# Patient Record
Sex: Female | Born: 1945
Health system: Southern US, Community
[De-identification: ages and names within clinical notes are randomized; demographics above are authoritative.]

## PROBLEM LIST (undated history)

## (undated) DIAGNOSIS — F329 Major depressive disorder, single episode, unspecified: Secondary | ICD-10-CM

## (undated) DIAGNOSIS — E785 Hyperlipidemia, unspecified: Secondary | ICD-10-CM

## (undated) DIAGNOSIS — C801 Malignant (primary) neoplasm, unspecified: Secondary | ICD-10-CM

## (undated) DIAGNOSIS — F32A Depression, unspecified: Secondary | ICD-10-CM

## (undated) DIAGNOSIS — G8929 Other chronic pain: Secondary | ICD-10-CM

## (undated) DIAGNOSIS — S52502A Unspecified fracture of the lower end of left radius, initial encounter for closed fracture: Secondary | ICD-10-CM

## (undated) DIAGNOSIS — K219 Gastro-esophageal reflux disease without esophagitis: Secondary | ICD-10-CM

## (undated) DIAGNOSIS — F419 Anxiety disorder, unspecified: Secondary | ICD-10-CM

## (undated) HISTORY — PX: LAPAROSCOPY: SHX197

## (undated) HISTORY — PX: HAND SURGERY: SHX662

## (undated) HISTORY — DX: Malignant (primary) neoplasm, unspecified: C80.1

## (undated) HISTORY — PX: EYE SURGERY: SHX253

## (undated) HISTORY — DX: Hyperlipidemia, unspecified: E78.5

## (undated) HISTORY — DX: Gastro-esophageal reflux disease without esophagitis: K21.9

## (undated) HISTORY — DX: Depression, unspecified: F32.A

---
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
## 1998-01-16 ENCOUNTER — Ambulatory Visit (HOSPITAL_COMMUNITY): Admission: RE | Admit: 1998-01-16 | Discharge: 1998-01-16 | Payer: Self-pay | Admitting: Gynecology

## 1998-01-23 ENCOUNTER — Other Ambulatory Visit: Admission: RE | Admit: 1998-01-23 | Discharge: 1998-01-23 | Payer: Self-pay | Admitting: Gynecology

## 1999-03-13 ENCOUNTER — Other Ambulatory Visit: Admission: RE | Admit: 1999-03-13 | Discharge: 1999-03-13 | Payer: Self-pay | Admitting: Gynecology

## 1999-03-13 ENCOUNTER — Encounter (INDEPENDENT_AMBULATORY_CARE_PROVIDER_SITE_OTHER): Payer: Self-pay

## 1999-08-13 ENCOUNTER — Emergency Department (HOSPITAL_COMMUNITY): Admission: EM | Admit: 1999-08-13 | Discharge: 1999-08-14 | Payer: Self-pay | Admitting: Emergency Medicine

## 1999-08-27 ENCOUNTER — Encounter: Payer: Self-pay | Admitting: Gynecology

## 1999-08-27 ENCOUNTER — Ambulatory Visit (HOSPITAL_COMMUNITY): Admission: RE | Admit: 1999-08-27 | Discharge: 1999-08-27 | Payer: Self-pay | Admitting: Gynecology

## 2000-04-20 ENCOUNTER — Other Ambulatory Visit: Admission: RE | Admit: 2000-04-20 | Discharge: 2000-04-20 | Payer: Self-pay | Admitting: Gynecology

## 2001-05-17 ENCOUNTER — Other Ambulatory Visit: Admission: RE | Admit: 2001-05-17 | Discharge: 2001-05-17 | Payer: Self-pay | Admitting: Gynecology

## 2001-05-18 ENCOUNTER — Encounter: Payer: Self-pay | Admitting: Gynecology

## 2001-05-18 ENCOUNTER — Ambulatory Visit (HOSPITAL_COMMUNITY): Admission: RE | Admit: 2001-05-18 | Discharge: 2001-05-18 | Payer: Self-pay | Admitting: Gynecology

## 2001-10-14 ENCOUNTER — Encounter: Admission: RE | Admit: 2001-10-14 | Discharge: 2001-10-14 | Payer: Self-pay | Admitting: Gynecology

## 2001-10-14 ENCOUNTER — Encounter: Payer: Self-pay | Admitting: Gynecology

## 2002-07-25 ENCOUNTER — Other Ambulatory Visit: Admission: RE | Admit: 2002-07-25 | Discharge: 2002-07-25 | Payer: Self-pay | Admitting: Gynecology

## 2002-10-03 ENCOUNTER — Encounter: Admission: RE | Admit: 2002-10-03 | Discharge: 2002-10-03 | Payer: Self-pay | Admitting: Gynecology

## 2002-10-03 ENCOUNTER — Encounter: Payer: Self-pay | Admitting: Gynecology

## 2003-07-25 ENCOUNTER — Other Ambulatory Visit: Admission: RE | Admit: 2003-07-25 | Discharge: 2003-07-25 | Payer: Self-pay | Admitting: Gynecology

## 2004-01-30 ENCOUNTER — Ambulatory Visit (HOSPITAL_COMMUNITY): Admission: RE | Admit: 2004-01-30 | Discharge: 2004-01-30 | Payer: Self-pay | Admitting: Gastroenterology

## 2004-11-19 ENCOUNTER — Other Ambulatory Visit: Admission: RE | Admit: 2004-11-19 | Discharge: 2004-11-19 | Payer: Self-pay | Admitting: Gynecology

## 2004-12-23 ENCOUNTER — Ambulatory Visit (HOSPITAL_COMMUNITY): Admission: RE | Admit: 2004-12-23 | Discharge: 2004-12-23 | Payer: Self-pay | Admitting: Gynecology

## 2006-02-13 ENCOUNTER — Ambulatory Visit (HOSPITAL_COMMUNITY): Admission: RE | Admit: 2006-02-13 | Discharge: 2006-02-13 | Payer: Self-pay | Admitting: Gynecology

## 2007-11-12 ENCOUNTER — Ambulatory Visit (HOSPITAL_COMMUNITY): Admission: RE | Admit: 2007-11-12 | Discharge: 2007-11-12 | Payer: Self-pay | Admitting: Gynecology

## 2010-03-23 ENCOUNTER — Encounter: Payer: Self-pay | Admitting: Gynecology

## 2010-07-19 NOTE — Op Note (Signed)
NAMEDONYELLE, ENYEART              ACCOUNT NO.:  [id number]   MEDICAL RECORD NO.:  [id number]          PATIENT TYPE:  AMB   LOCATION:  ENDO                         FACILITY:  [facility]   PHYSICIAN:  Graylin Shiver, M.D.   DATE OF BIRTH:  05-Mar-1945   DATE OF PROCEDURE:  01/30/2004  DATE OF DISCHARGE:                                 OPERATIVE REPORT   PROCEDURE:  Colonoscopy.   INDICATION:  Screening.   Informed consent was obtained after explanation of the risks of bleeding,  infection, or perforation.   PREMEDICATION:  Fentanyl 75 mcg IV, Versed 5 mg IV.   DESCRIPTION OF PROCEDURE:  With the patient in the left lateral decubitus  position, a rectal exam was performed and no masses were felt.  The Olympus  colonoscope was inserted into the rectum and advanced around the colon to  the cecum, cecal landmarks were identified.  The cecum and ascending colon  were normal.  The transverse colon was normal.  The descending colon,  sigmoid and rectum were normal.  She tolerated the procedure well without  complications.   IMPRESSION:  Normal colonoscopy to the cecum.       SFG/MEDQ  D:  01/30/2004  T:  01/30/2004  Job:  [id number]   cc:   Sigmund Hazel, M.D.  [address]  [address] [place], [place] [address]  Fax: [phone]

## 2015-03-06 DIAGNOSIS — M9901 Segmental and somatic dysfunction of cervical region: Secondary | ICD-10-CM | POA: Diagnosis not present

## 2015-03-06 DIAGNOSIS — M9913 Subluxation complex (vertebral) of lumbar region: Secondary | ICD-10-CM | POA: Diagnosis not present

## 2015-03-06 DIAGNOSIS — M9912 Subluxation complex (vertebral) of thoracic region: Secondary | ICD-10-CM | POA: Diagnosis not present

## 2015-03-06 DIAGNOSIS — M9915 Subluxation complex (vertebral) of pelvic region: Secondary | ICD-10-CM | POA: Diagnosis not present

## 2015-03-06 DIAGNOSIS — M542 Cervicalgia: Secondary | ICD-10-CM | POA: Diagnosis not present

## 2015-03-06 DIAGNOSIS — M9911 Subluxation complex (vertebral) of cervical region: Secondary | ICD-10-CM | POA: Diagnosis not present

## 2015-03-06 DIAGNOSIS — M9914 Subluxation complex (vertebral) of sacral region: Secondary | ICD-10-CM | POA: Diagnosis not present

## 2015-03-14 DIAGNOSIS — Z1283 Encounter for screening for malignant neoplasm of skin: Secondary | ICD-10-CM | POA: Diagnosis not present

## 2015-03-14 DIAGNOSIS — Z85820 Personal history of malignant melanoma of skin: Secondary | ICD-10-CM | POA: Diagnosis not present

## 2015-03-14 DIAGNOSIS — Z08 Encounter for follow-up examination after completed treatment for malignant neoplasm: Secondary | ICD-10-CM | POA: Diagnosis not present

## 2015-04-16 DIAGNOSIS — M542 Cervicalgia: Secondary | ICD-10-CM | POA: Diagnosis not present

## 2015-04-16 DIAGNOSIS — M9911 Subluxation complex (vertebral) of cervical region: Secondary | ICD-10-CM | POA: Diagnosis not present

## 2015-04-16 DIAGNOSIS — M9901 Segmental and somatic dysfunction of cervical region: Secondary | ICD-10-CM | POA: Diagnosis not present

## 2015-04-16 DIAGNOSIS — M9914 Subluxation complex (vertebral) of sacral region: Secondary | ICD-10-CM | POA: Diagnosis not present

## 2015-04-16 DIAGNOSIS — M9912 Subluxation complex (vertebral) of thoracic region: Secondary | ICD-10-CM | POA: Diagnosis not present

## 2015-04-16 DIAGNOSIS — M9915 Subluxation complex (vertebral) of pelvic region: Secondary | ICD-10-CM | POA: Diagnosis not present

## 2015-04-16 DIAGNOSIS — M9913 Subluxation complex (vertebral) of lumbar region: Secondary | ICD-10-CM | POA: Diagnosis not present

## 2015-05-16 DIAGNOSIS — M542 Cervicalgia: Secondary | ICD-10-CM | POA: Diagnosis not present

## 2015-05-16 DIAGNOSIS — M9913 Subluxation complex (vertebral) of lumbar region: Secondary | ICD-10-CM | POA: Diagnosis not present

## 2015-05-16 DIAGNOSIS — M9912 Subluxation complex (vertebral) of thoracic region: Secondary | ICD-10-CM | POA: Diagnosis not present

## 2015-05-16 DIAGNOSIS — M9915 Subluxation complex (vertebral) of pelvic region: Secondary | ICD-10-CM | POA: Diagnosis not present

## 2015-05-16 DIAGNOSIS — M9911 Subluxation complex (vertebral) of cervical region: Secondary | ICD-10-CM | POA: Diagnosis not present

## 2015-05-16 DIAGNOSIS — M9901 Segmental and somatic dysfunction of cervical region: Secondary | ICD-10-CM | POA: Diagnosis not present

## 2015-05-16 DIAGNOSIS — M9914 Subluxation complex (vertebral) of sacral region: Secondary | ICD-10-CM | POA: Diagnosis not present

## 2015-06-06 DIAGNOSIS — M8589 Other specified disorders of bone density and structure, multiple sites: Secondary | ICD-10-CM | POA: Diagnosis not present

## 2015-06-06 DIAGNOSIS — M859 Disorder of bone density and structure, unspecified: Secondary | ICD-10-CM | POA: Diagnosis not present

## 2015-09-03 DIAGNOSIS — M9901 Segmental and somatic dysfunction of cervical region: Secondary | ICD-10-CM | POA: Diagnosis not present

## 2015-09-03 DIAGNOSIS — M542 Cervicalgia: Secondary | ICD-10-CM | POA: Diagnosis not present

## 2015-10-03 DIAGNOSIS — E7800 Pure hypercholesterolemia, unspecified: Secondary | ICD-10-CM | POA: Diagnosis not present

## 2015-10-03 DIAGNOSIS — Z0000 Encounter for general adult medical examination without abnormal findings: Secondary | ICD-10-CM | POA: Diagnosis not present

## 2015-10-03 DIAGNOSIS — K219 Gastro-esophageal reflux disease without esophagitis: Secondary | ICD-10-CM | POA: Diagnosis not present

## 2015-10-08 DIAGNOSIS — M9901 Segmental and somatic dysfunction of cervical region: Secondary | ICD-10-CM | POA: Diagnosis not present

## 2015-10-08 DIAGNOSIS — M542 Cervicalgia: Secondary | ICD-10-CM | POA: Diagnosis not present

## 2015-10-09 DIAGNOSIS — M9901 Segmental and somatic dysfunction of cervical region: Secondary | ICD-10-CM | POA: Diagnosis not present

## 2015-10-09 DIAGNOSIS — M542 Cervicalgia: Secondary | ICD-10-CM | POA: Diagnosis not present

## 2015-10-10 DIAGNOSIS — M9901 Segmental and somatic dysfunction of cervical region: Secondary | ICD-10-CM | POA: Diagnosis not present

## 2015-10-10 DIAGNOSIS — M542 Cervicalgia: Secondary | ICD-10-CM | POA: Diagnosis not present

## 2015-10-11 DIAGNOSIS — M9901 Segmental and somatic dysfunction of cervical region: Secondary | ICD-10-CM | POA: Diagnosis not present

## 2015-10-11 DIAGNOSIS — M542 Cervicalgia: Secondary | ICD-10-CM | POA: Diagnosis not present

## 2015-10-16 DIAGNOSIS — M542 Cervicalgia: Secondary | ICD-10-CM | POA: Diagnosis not present

## 2015-10-16 DIAGNOSIS — M9901 Segmental and somatic dysfunction of cervical region: Secondary | ICD-10-CM | POA: Diagnosis not present

## 2016-01-01 DIAGNOSIS — M4802 Spinal stenosis, cervical region: Secondary | ICD-10-CM | POA: Diagnosis not present

## 2016-01-01 DIAGNOSIS — Z23 Encounter for immunization: Secondary | ICD-10-CM | POA: Diagnosis not present

## 2016-01-01 DIAGNOSIS — Z79899 Other long term (current) drug therapy: Secondary | ICD-10-CM | POA: Diagnosis not present

## 2016-01-01 DIAGNOSIS — E7800 Pure hypercholesterolemia, unspecified: Secondary | ICD-10-CM | POA: Diagnosis not present

## 2016-01-01 DIAGNOSIS — R69 Illness, unspecified: Secondary | ICD-10-CM | POA: Diagnosis not present

## 2016-01-01 DIAGNOSIS — Z1159 Encounter for screening for other viral diseases: Secondary | ICD-10-CM | POA: Diagnosis not present

## 2016-01-01 DIAGNOSIS — Z0000 Encounter for general adult medical examination without abnormal findings: Secondary | ICD-10-CM | POA: Diagnosis not present

## 2016-01-01 DIAGNOSIS — Z85820 Personal history of malignant melanoma of skin: Secondary | ICD-10-CM | POA: Diagnosis not present

## 2016-01-01 DIAGNOSIS — M859 Disorder of bone density and structure, unspecified: Secondary | ICD-10-CM | POA: Diagnosis not present

## 2016-02-06 DIAGNOSIS — M542 Cervicalgia: Secondary | ICD-10-CM | POA: Diagnosis not present

## 2016-02-06 DIAGNOSIS — M9901 Segmental and somatic dysfunction of cervical region: Secondary | ICD-10-CM | POA: Diagnosis not present

## 2016-03-20 DIAGNOSIS — M9901 Segmental and somatic dysfunction of cervical region: Secondary | ICD-10-CM | POA: Diagnosis not present

## 2016-03-20 DIAGNOSIS — M542 Cervicalgia: Secondary | ICD-10-CM | POA: Diagnosis not present

## 2016-03-24 DIAGNOSIS — M9901 Segmental and somatic dysfunction of cervical region: Secondary | ICD-10-CM | POA: Diagnosis not present

## 2016-03-24 DIAGNOSIS — M542 Cervicalgia: Secondary | ICD-10-CM | POA: Diagnosis not present

## 2016-04-01 DIAGNOSIS — Z01419 Encounter for gynecological examination (general) (routine) without abnormal findings: Secondary | ICD-10-CM | POA: Diagnosis not present

## 2016-04-01 DIAGNOSIS — Z6826 Body mass index (BMI) 26.0-26.9, adult: Secondary | ICD-10-CM | POA: Diagnosis not present

## 2016-04-01 DIAGNOSIS — Z1231 Encounter for screening mammogram for malignant neoplasm of breast: Secondary | ICD-10-CM | POA: Diagnosis not present

## 2016-05-06 DIAGNOSIS — Z0000 Encounter for general adult medical examination without abnormal findings: Secondary | ICD-10-CM | POA: Diagnosis not present

## 2016-05-06 DIAGNOSIS — M13162 Monoarthritis, not elsewhere classified, left knee: Secondary | ICD-10-CM | POA: Diagnosis not present

## 2016-05-06 DIAGNOSIS — E7800 Pure hypercholesterolemia, unspecified: Secondary | ICD-10-CM | POA: Diagnosis not present

## 2016-05-06 DIAGNOSIS — N329 Bladder disorder, unspecified: Secondary | ICD-10-CM | POA: Diagnosis not present

## 2016-05-06 DIAGNOSIS — K219 Gastro-esophageal reflux disease without esophagitis: Secondary | ICD-10-CM | POA: Diagnosis not present

## 2016-05-06 DIAGNOSIS — R69 Illness, unspecified: Secondary | ICD-10-CM | POA: Diagnosis not present

## 2016-05-06 DIAGNOSIS — M13161 Monoarthritis, not elsewhere classified, right knee: Secondary | ICD-10-CM | POA: Diagnosis not present

## 2016-05-06 DIAGNOSIS — Z6826 Body mass index (BMI) 26.0-26.9, adult: Secondary | ICD-10-CM | POA: Diagnosis not present

## 2016-05-07 DIAGNOSIS — M9907 Segmental and somatic dysfunction of upper extremity: Secondary | ICD-10-CM | POA: Diagnosis not present

## 2016-05-07 DIAGNOSIS — M542 Cervicalgia: Secondary | ICD-10-CM | POA: Diagnosis not present

## 2016-05-07 DIAGNOSIS — M9901 Segmental and somatic dysfunction of cervical region: Secondary | ICD-10-CM | POA: Diagnosis not present

## 2016-05-07 DIAGNOSIS — M9905 Segmental and somatic dysfunction of pelvic region: Secondary | ICD-10-CM | POA: Diagnosis not present

## 2016-05-07 DIAGNOSIS — M9902 Segmental and somatic dysfunction of thoracic region: Secondary | ICD-10-CM | POA: Diagnosis not present

## 2016-05-07 DIAGNOSIS — M9903 Segmental and somatic dysfunction of lumbar region: Secondary | ICD-10-CM | POA: Diagnosis not present

## 2016-08-12 DIAGNOSIS — M9901 Segmental and somatic dysfunction of cervical region: Secondary | ICD-10-CM | POA: Diagnosis not present

## 2016-08-12 DIAGNOSIS — M542 Cervicalgia: Secondary | ICD-10-CM | POA: Diagnosis not present

## 2016-09-10 DIAGNOSIS — M9903 Segmental and somatic dysfunction of lumbar region: Secondary | ICD-10-CM | POA: Diagnosis not present

## 2016-09-10 DIAGNOSIS — M9901 Segmental and somatic dysfunction of cervical region: Secondary | ICD-10-CM | POA: Diagnosis not present

## 2016-09-10 DIAGNOSIS — M9902 Segmental and somatic dysfunction of thoracic region: Secondary | ICD-10-CM | POA: Diagnosis not present

## 2016-10-14 DIAGNOSIS — L821 Other seborrheic keratosis: Secondary | ICD-10-CM | POA: Diagnosis not present

## 2016-10-14 DIAGNOSIS — M1711 Unilateral primary osteoarthritis, right knee: Secondary | ICD-10-CM | POA: Diagnosis not present

## 2016-10-14 DIAGNOSIS — Z85820 Personal history of malignant melanoma of skin: Secondary | ICD-10-CM | POA: Diagnosis not present

## 2016-10-14 DIAGNOSIS — Z1283 Encounter for screening for malignant neoplasm of skin: Secondary | ICD-10-CM | POA: Diagnosis not present

## 2016-10-14 DIAGNOSIS — M1712 Unilateral primary osteoarthritis, left knee: Secondary | ICD-10-CM | POA: Diagnosis not present

## 2016-10-14 DIAGNOSIS — C44619 Basal cell carcinoma of skin of left upper limb, including shoulder: Secondary | ICD-10-CM | POA: Diagnosis not present

## 2016-10-14 DIAGNOSIS — Z08 Encounter for follow-up examination after completed treatment for malignant neoplasm: Secondary | ICD-10-CM | POA: Diagnosis not present

## 2016-11-04 DIAGNOSIS — M9903 Segmental and somatic dysfunction of lumbar region: Secondary | ICD-10-CM | POA: Diagnosis not present

## 2016-11-04 DIAGNOSIS — M9901 Segmental and somatic dysfunction of cervical region: Secondary | ICD-10-CM | POA: Diagnosis not present

## 2016-11-04 DIAGNOSIS — M9902 Segmental and somatic dysfunction of thoracic region: Secondary | ICD-10-CM | POA: Diagnosis not present

## 2017-01-13 DIAGNOSIS — R69 Illness, unspecified: Secondary | ICD-10-CM | POA: Diagnosis not present

## 2017-01-13 DIAGNOSIS — Z85820 Personal history of malignant melanoma of skin: Secondary | ICD-10-CM | POA: Diagnosis not present

## 2017-01-13 DIAGNOSIS — Z79899 Other long term (current) drug therapy: Secondary | ICD-10-CM | POA: Diagnosis not present

## 2017-01-13 DIAGNOSIS — M4802 Spinal stenosis, cervical region: Secondary | ICD-10-CM | POA: Diagnosis not present

## 2017-01-13 DIAGNOSIS — M859 Disorder of bone density and structure, unspecified: Secondary | ICD-10-CM | POA: Diagnosis not present

## 2017-01-13 DIAGNOSIS — Z0000 Encounter for general adult medical examination without abnormal findings: Secondary | ICD-10-CM | POA: Diagnosis not present

## 2017-01-13 DIAGNOSIS — E7800 Pure hypercholesterolemia, unspecified: Secondary | ICD-10-CM | POA: Diagnosis not present

## 2017-01-13 DIAGNOSIS — Z23 Encounter for immunization: Secondary | ICD-10-CM | POA: Diagnosis not present

## 2017-01-13 DIAGNOSIS — M170 Bilateral primary osteoarthritis of knee: Secondary | ICD-10-CM | POA: Diagnosis not present

## 2017-01-20 DIAGNOSIS — M9901 Segmental and somatic dysfunction of cervical region: Secondary | ICD-10-CM | POA: Diagnosis not present

## 2017-01-20 DIAGNOSIS — M9902 Segmental and somatic dysfunction of thoracic region: Secondary | ICD-10-CM | POA: Diagnosis not present

## 2017-01-20 DIAGNOSIS — M9903 Segmental and somatic dysfunction of lumbar region: Secondary | ICD-10-CM | POA: Diagnosis not present

## 2017-02-05 DIAGNOSIS — M1711 Unilateral primary osteoarthritis, right knee: Secondary | ICD-10-CM | POA: Diagnosis not present

## 2017-02-05 DIAGNOSIS — M1712 Unilateral primary osteoarthritis, left knee: Secondary | ICD-10-CM | POA: Diagnosis not present

## 2017-02-26 ENCOUNTER — Ambulatory Visit: Payer: Medicare HMO | Admitting: Urgent Care

## 2017-02-26 ENCOUNTER — Encounter: Payer: Self-pay | Admitting: Urgent Care

## 2017-02-26 VITALS — BP 121/79 | HR 74 | Temp 98.0°F | Resp 18 | Ht 63.0 in | Wt 147.8 lb

## 2017-02-26 DIAGNOSIS — J3489 Other specified disorders of nose and nasal sinuses: Secondary | ICD-10-CM | POA: Diagnosis not present

## 2017-02-26 DIAGNOSIS — R0982 Postnasal drip: Secondary | ICD-10-CM | POA: Diagnosis not present

## 2017-02-26 DIAGNOSIS — J0100 Acute maxillary sinusitis, unspecified: Secondary | ICD-10-CM

## 2017-02-26 DIAGNOSIS — R0981 Nasal congestion: Secondary | ICD-10-CM

## 2017-02-26 MED ORDER — AMOXICILLIN 500 MG PO CAPS: 500.0000 mg | ORAL_CAPSULE | Freq: Three times a day (TID) | ORAL | 0 refills | Status: DC

## 2017-02-26 NOTE — Patient Instructions (Signed)
Sinusitis, Adult Sinusitis is soreness and inflammation of your sinuses. Sinuses are hollow spaces in the bones around your face. They are located:  Around your eyes.  In the middle of your forehead.  Behind your nose.  In your cheekbones.  Your sinuses and nasal passages are lined with a stringy fluid (mucus). Mucus normally drains out of your sinuses. When your nasal tissues get inflamed or swollen, the mucus can get trapped or blocked so air cannot flow through your sinuses. This lets bacteria, viruses, and funguses grow, and that leads to infection. Follow these instructions at home: Medicines  Take, use, or apply over-the-counter and prescription medicines only as told by your doctor. These may include nasal sprays.  If you were prescribed an antibiotic medicine, take it as told by your doctor. Do not stop taking the antibiotic even if you start to feel better. Hydrate and Humidify  Drink enough water to keep your pee (urine) clear or pale yellow.  Use a cool mist humidifier to keep the humidity level in your home above 50%.  Breathe in steam for 10-15 minutes, 3-4 times a day or as told by your doctor. You can do this in the bathroom while a hot shower is running.  Try not to spend time in cool or dry air. Rest  Rest as much as possible.  Sleep with your head raised (elevated).  Make sure to get enough sleep each night. General instructions  Put a warm, moist washcloth on your face 3-4 times a day or as told by your doctor. This will help with discomfort.  Wash your hands often with soap and water. If there is no soap and water, use hand sanitizer.  Do not smoke. Avoid being around people who are smoking (secondhand smoke).  Keep all follow-up visits as told by your doctor. This is important. Contact a doctor if:  You have a fever.  Your symptoms get worse.  Your symptoms do not get better within 10 days. Get help right away if:  You have a very bad  headache.  You cannot stop throwing up (vomiting).  You have pain or swelling around your face or eyes.  You have trouble seeing.  You feel confused.  Your neck is stiff.  You have trouble breathing. This information is not intended to replace advice given to you by your health care provider. Make sure you discuss any questions you have with your health care provider. Document Released: 08/06/2007 Document Revised: 10/14/2015 Document Reviewed: 12/13/2014 Elsevier Interactive Patient Education  2018 Elsevier Inc.     IF you received an x-ray today, you will receive an invoice from [facility]. Please contact [facility] at [phone] with questions or concerns regarding your invoice.   IF you received labwork today, you will receive an invoice from [facility]. Please contact [facility] at [phone] with questions or concerns regarding your invoice.   Our billing staff will not be able to assist you with questions regarding bills from these companies.  You will be contacted with the lab results as soon as they are available. The fastest way to get your results is to activate your My Chart account. Instructions are located on the last page of this paperwork. If you have not heard from us regarding the results in 2 weeks, please contact this office.    ' 

## 2017-02-26 NOTE — Progress Notes (Signed)
   MRN: [id number] DOB: [birth year]  Subjective:   Whitney Monroe is a 71 y.o. female presenting for 1.5 week history of sinus congestion, sinus pain (R>L), runny nose, fatigue, sore throat, productive cough that elicits mild occasional chest pain. Has tried Sudafed, benadryl, Mucinex, Alleve, otc nasal spray, chloraseptic spray. Denies fever, shob, wheezing, n/v, abdominal pain, rashes. Has seasonal allergies but does not take anything consistently for this. Denies history of asthma.  Whitney Monroe has a current medication list which includes the following prescription(s): omeprazole, sertraline, and simvastatin. Also has No Known Allergies.  Whitney Monroe  has a past medical history of Cancer ([place]) and GERD (gastroesophageal reflux disease).  Had surgical excision of melanoma of her left thigh. Her family history includes Cancer in her brother and sister; Heart disease in her father; Hyperlipidemia in her brother, father, and sister.   Objective:   Vitals: BP 121/79   Pulse 74   Temp 98 F (36.7 C) (Oral)   Resp 18   Ht 5\' 3"  (1.6 m)   Wt 147 lb 12.8 oz (67 kg)   SpO2 97%   BMI 26.18 kg/m   Physical Exam  Constitutional: She is oriented to person, place, and time. She appears well-developed and well-nourished.  HENT:  TM's intact bilaterally, no effusions or erythema. Nasal turbinates pink and moist, nasal passages patent. Bilateral maxillary sinus tenderness. Oropharynx with moderate post-nasal drainage, mucous membranes moist.  Eyes: Right eye exhibits no discharge. Left eye exhibits no discharge. No scleral icterus.  Neck: Normal range of motion. Neck supple.  Cardiovascular: Normal rate, regular rhythm and intact distal pulses. Exam reveals no gallop and no friction rub.  No murmur heard. Pulmonary/Chest: No respiratory distress. She has no wheezes. She has no rales.  Lymphadenopathy:    She has no cervical adenopathy.  Neurological: She is alert and oriented to person, place, and time.    Skin: Skin is warm and dry.  Psychiatric:  Irritable mood.   Assessment and Plan :   Acute maxillary sinusitis, recurrence not specified  Sinus pain  Sinus congestion  Post-nasal drainage   Start amoxicillin, use Sudafed for pnd, nasal congestion. Return-to-clinic precautions discussed, patient verbalized understanding.   Jaynee Eagles, PA-C [facility] [phone] 02/26/2017  2:17 PM

## 2017-05-21 DIAGNOSIS — M9905 Segmental and somatic dysfunction of pelvic region: Secondary | ICD-10-CM | POA: Diagnosis not present

## 2017-05-21 DIAGNOSIS — M542 Cervicalgia: Secondary | ICD-10-CM | POA: Diagnosis not present

## 2017-05-21 DIAGNOSIS — M9907 Segmental and somatic dysfunction of upper extremity: Secondary | ICD-10-CM | POA: Diagnosis not present

## 2017-05-21 DIAGNOSIS — M9901 Segmental and somatic dysfunction of cervical region: Secondary | ICD-10-CM | POA: Diagnosis not present

## 2017-05-21 DIAGNOSIS — M9902 Segmental and somatic dysfunction of thoracic region: Secondary | ICD-10-CM | POA: Diagnosis not present

## 2017-05-21 DIAGNOSIS — M9903 Segmental and somatic dysfunction of lumbar region: Secondary | ICD-10-CM | POA: Diagnosis not present

## 2017-06-26 DIAGNOSIS — Z6825 Body mass index (BMI) 25.0-25.9, adult: Secondary | ICD-10-CM | POA: Diagnosis not present

## 2017-06-26 DIAGNOSIS — Z01419 Encounter for gynecological examination (general) (routine) without abnormal findings: Secondary | ICD-10-CM | POA: Diagnosis not present

## 2017-06-26 DIAGNOSIS — N958 Other specified menopausal and perimenopausal disorders: Secondary | ICD-10-CM | POA: Diagnosis not present

## 2017-06-26 DIAGNOSIS — Z1231 Encounter for screening mammogram for malignant neoplasm of breast: Secondary | ICD-10-CM | POA: Diagnosis not present

## 2017-07-15 DIAGNOSIS — H2513 Age-related nuclear cataract, bilateral: Secondary | ICD-10-CM | POA: Diagnosis not present

## 2017-07-15 DIAGNOSIS — H52203 Unspecified astigmatism, bilateral: Secondary | ICD-10-CM | POA: Diagnosis not present

## 2017-08-06 DIAGNOSIS — M9902 Segmental and somatic dysfunction of thoracic region: Secondary | ICD-10-CM | POA: Diagnosis not present

## 2017-08-06 DIAGNOSIS — M9907 Segmental and somatic dysfunction of upper extremity: Secondary | ICD-10-CM | POA: Diagnosis not present

## 2017-08-06 DIAGNOSIS — M9905 Segmental and somatic dysfunction of pelvic region: Secondary | ICD-10-CM | POA: Diagnosis not present

## 2017-08-06 DIAGNOSIS — M542 Cervicalgia: Secondary | ICD-10-CM | POA: Diagnosis not present

## 2017-08-06 DIAGNOSIS — M9901 Segmental and somatic dysfunction of cervical region: Secondary | ICD-10-CM | POA: Diagnosis not present

## 2017-08-06 DIAGNOSIS — M9903 Segmental and somatic dysfunction of lumbar region: Secondary | ICD-10-CM | POA: Diagnosis not present

## 2017-09-01 DIAGNOSIS — X32XXXD Exposure to sunlight, subsequent encounter: Secondary | ICD-10-CM | POA: Diagnosis not present

## 2017-09-01 DIAGNOSIS — L570 Actinic keratosis: Secondary | ICD-10-CM | POA: Diagnosis not present

## 2017-09-01 DIAGNOSIS — Z1283 Encounter for screening for malignant neoplasm of skin: Secondary | ICD-10-CM | POA: Diagnosis not present

## 2017-09-01 DIAGNOSIS — Z08 Encounter for follow-up examination after completed treatment for malignant neoplasm: Secondary | ICD-10-CM | POA: Diagnosis not present

## 2017-09-01 DIAGNOSIS — Z85820 Personal history of malignant melanoma of skin: Secondary | ICD-10-CM | POA: Diagnosis not present

## 2017-09-01 DIAGNOSIS — L821 Other seborrheic keratosis: Secondary | ICD-10-CM | POA: Diagnosis not present

## 2017-09-16 DIAGNOSIS — Z5181 Encounter for therapeutic drug level monitoring: Secondary | ICD-10-CM | POA: Diagnosis not present

## 2017-09-16 DIAGNOSIS — M179 Osteoarthritis of knee, unspecified: Secondary | ICD-10-CM | POA: Diagnosis not present

## 2017-09-16 DIAGNOSIS — M519 Unspecified thoracic, thoracolumbar and lumbosacral intervertebral disc disorder: Secondary | ICD-10-CM | POA: Diagnosis not present

## 2017-09-16 DIAGNOSIS — G894 Chronic pain syndrome: Secondary | ICD-10-CM | POA: Diagnosis not present

## 2017-09-24 DIAGNOSIS — I636 Cerebral infarction due to cerebral venous thrombosis, nonpyogenic: Secondary | ICD-10-CM | POA: Diagnosis not present

## 2017-10-23 DIAGNOSIS — M9902 Segmental and somatic dysfunction of thoracic region: Secondary | ICD-10-CM | POA: Diagnosis not present

## 2017-10-23 DIAGNOSIS — M9901 Segmental and somatic dysfunction of cervical region: Secondary | ICD-10-CM | POA: Diagnosis not present

## 2017-10-23 DIAGNOSIS — M9903 Segmental and somatic dysfunction of lumbar region: Secondary | ICD-10-CM | POA: Diagnosis not present

## 2017-10-29 DIAGNOSIS — M9901 Segmental and somatic dysfunction of cervical region: Secondary | ICD-10-CM | POA: Diagnosis not present

## 2017-10-29 DIAGNOSIS — M9903 Segmental and somatic dysfunction of lumbar region: Secondary | ICD-10-CM | POA: Diagnosis not present

## 2017-10-29 DIAGNOSIS — M9902 Segmental and somatic dysfunction of thoracic region: Secondary | ICD-10-CM | POA: Diagnosis not present

## 2017-12-07 DIAGNOSIS — M9901 Segmental and somatic dysfunction of cervical region: Secondary | ICD-10-CM | POA: Diagnosis not present

## 2017-12-07 DIAGNOSIS — M9903 Segmental and somatic dysfunction of lumbar region: Secondary | ICD-10-CM | POA: Diagnosis not present

## 2017-12-07 DIAGNOSIS — M9902 Segmental and somatic dysfunction of thoracic region: Secondary | ICD-10-CM | POA: Diagnosis not present

## 2017-12-21 DIAGNOSIS — M9904 Segmental and somatic dysfunction of sacral region: Secondary | ICD-10-CM | POA: Diagnosis not present

## 2017-12-21 DIAGNOSIS — M9902 Segmental and somatic dysfunction of thoracic region: Secondary | ICD-10-CM | POA: Diagnosis not present

## 2017-12-21 DIAGNOSIS — M9901 Segmental and somatic dysfunction of cervical region: Secondary | ICD-10-CM | POA: Diagnosis not present

## 2017-12-21 DIAGNOSIS — M9903 Segmental and somatic dysfunction of lumbar region: Secondary | ICD-10-CM | POA: Diagnosis not present

## 2018-01-13 DIAGNOSIS — M9902 Segmental and somatic dysfunction of thoracic region: Secondary | ICD-10-CM | POA: Diagnosis not present

## 2018-01-13 DIAGNOSIS — M9901 Segmental and somatic dysfunction of cervical region: Secondary | ICD-10-CM | POA: Diagnosis not present

## 2018-01-13 DIAGNOSIS — M9903 Segmental and somatic dysfunction of lumbar region: Secondary | ICD-10-CM | POA: Diagnosis not present

## 2018-01-13 DIAGNOSIS — M9904 Segmental and somatic dysfunction of sacral region: Secondary | ICD-10-CM | POA: Diagnosis not present

## 2018-01-20 DIAGNOSIS — M9902 Segmental and somatic dysfunction of thoracic region: Secondary | ICD-10-CM | POA: Diagnosis not present

## 2018-01-20 DIAGNOSIS — M9903 Segmental and somatic dysfunction of lumbar region: Secondary | ICD-10-CM | POA: Diagnosis not present

## 2018-01-20 DIAGNOSIS — M9901 Segmental and somatic dysfunction of cervical region: Secondary | ICD-10-CM | POA: Diagnosis not present

## 2018-01-20 DIAGNOSIS — M9904 Segmental and somatic dysfunction of sacral region: Secondary | ICD-10-CM | POA: Diagnosis not present

## 2018-01-21 DIAGNOSIS — R69 Illness, unspecified: Secondary | ICD-10-CM | POA: Diagnosis not present

## 2018-01-21 DIAGNOSIS — M5116 Intervertebral disc disorders with radiculopathy, lumbar region: Secondary | ICD-10-CM | POA: Diagnosis not present

## 2018-01-21 DIAGNOSIS — M545 Low back pain: Secondary | ICD-10-CM | POA: Diagnosis not present

## 2018-01-26 DIAGNOSIS — M5116 Intervertebral disc disorders with radiculopathy, lumbar region: Secondary | ICD-10-CM | POA: Diagnosis not present

## 2018-01-26 DIAGNOSIS — R69 Illness, unspecified: Secondary | ICD-10-CM | POA: Diagnosis not present

## 2018-01-26 DIAGNOSIS — M545 Low back pain: Secondary | ICD-10-CM | POA: Diagnosis not present

## 2018-02-09 DIAGNOSIS — M170 Bilateral primary osteoarthritis of knee: Secondary | ICD-10-CM | POA: Diagnosis not present

## 2018-02-09 DIAGNOSIS — Z0000 Encounter for general adult medical examination without abnormal findings: Secondary | ICD-10-CM | POA: Diagnosis not present

## 2018-02-09 DIAGNOSIS — K219 Gastro-esophageal reflux disease without esophagitis: Secondary | ICD-10-CM | POA: Diagnosis not present

## 2018-02-09 DIAGNOSIS — E7800 Pure hypercholesterolemia, unspecified: Secondary | ICD-10-CM | POA: Diagnosis not present

## 2018-02-09 DIAGNOSIS — R69 Illness, unspecified: Secondary | ICD-10-CM | POA: Diagnosis not present

## 2018-02-09 DIAGNOSIS — Z79899 Other long term (current) drug therapy: Secondary | ICD-10-CM | POA: Diagnosis not present

## 2018-02-09 DIAGNOSIS — Z23 Encounter for immunization: Secondary | ICD-10-CM | POA: Diagnosis not present

## 2018-02-09 DIAGNOSIS — M4802 Spinal stenosis, cervical region: Secondary | ICD-10-CM | POA: Diagnosis not present

## 2018-03-03 HISTORY — PX: JOINT REPLACEMENT: SHX530

## 2018-03-18 DIAGNOSIS — M9901 Segmental and somatic dysfunction of cervical region: Secondary | ICD-10-CM | POA: Diagnosis not present

## 2018-03-18 DIAGNOSIS — M9902 Segmental and somatic dysfunction of thoracic region: Secondary | ICD-10-CM | POA: Diagnosis not present

## 2018-03-18 DIAGNOSIS — M9903 Segmental and somatic dysfunction of lumbar region: Secondary | ICD-10-CM | POA: Diagnosis not present

## 2018-03-18 DIAGNOSIS — M9904 Segmental and somatic dysfunction of sacral region: Secondary | ICD-10-CM | POA: Diagnosis not present

## 2018-05-03 DIAGNOSIS — M9904 Segmental and somatic dysfunction of sacral region: Secondary | ICD-10-CM | POA: Diagnosis not present

## 2018-05-03 DIAGNOSIS — M9903 Segmental and somatic dysfunction of lumbar region: Secondary | ICD-10-CM | POA: Diagnosis not present

## 2018-05-03 DIAGNOSIS — M9901 Segmental and somatic dysfunction of cervical region: Secondary | ICD-10-CM | POA: Diagnosis not present

## 2018-05-03 DIAGNOSIS — M9902 Segmental and somatic dysfunction of thoracic region: Secondary | ICD-10-CM | POA: Diagnosis not present

## 2018-06-24 DIAGNOSIS — M9902 Segmental and somatic dysfunction of thoracic region: Secondary | ICD-10-CM | POA: Diagnosis not present

## 2018-06-24 DIAGNOSIS — M9903 Segmental and somatic dysfunction of lumbar region: Secondary | ICD-10-CM | POA: Diagnosis not present

## 2018-06-24 DIAGNOSIS — M9901 Segmental and somatic dysfunction of cervical region: Secondary | ICD-10-CM | POA: Diagnosis not present

## 2018-06-24 DIAGNOSIS — M9904 Segmental and somatic dysfunction of sacral region: Secondary | ICD-10-CM | POA: Diagnosis not present

## 2018-07-19 DIAGNOSIS — M9904 Segmental and somatic dysfunction of sacral region: Secondary | ICD-10-CM | POA: Diagnosis not present

## 2018-07-19 DIAGNOSIS — H2513 Age-related nuclear cataract, bilateral: Secondary | ICD-10-CM | POA: Diagnosis not present

## 2018-07-19 DIAGNOSIS — M9903 Segmental and somatic dysfunction of lumbar region: Secondary | ICD-10-CM | POA: Diagnosis not present

## 2018-07-19 DIAGNOSIS — M9902 Segmental and somatic dysfunction of thoracic region: Secondary | ICD-10-CM | POA: Diagnosis not present

## 2018-07-19 DIAGNOSIS — M9901 Segmental and somatic dysfunction of cervical region: Secondary | ICD-10-CM | POA: Diagnosis not present

## 2018-07-19 DIAGNOSIS — H52203 Unspecified astigmatism, bilateral: Secondary | ICD-10-CM | POA: Diagnosis not present

## 2018-07-28 DIAGNOSIS — R69 Illness, unspecified: Secondary | ICD-10-CM | POA: Diagnosis not present

## 2018-07-28 DIAGNOSIS — M5116 Intervertebral disc disorders with radiculopathy, lumbar region: Secondary | ICD-10-CM | POA: Diagnosis not present

## 2018-07-28 DIAGNOSIS — M545 Low back pain: Secondary | ICD-10-CM | POA: Diagnosis not present

## 2018-07-29 DIAGNOSIS — M1711 Unilateral primary osteoarthritis, right knee: Secondary | ICD-10-CM | POA: Diagnosis not present

## 2018-07-29 DIAGNOSIS — M170 Bilateral primary osteoarthritis of knee: Secondary | ICD-10-CM | POA: Diagnosis not present

## 2018-07-29 DIAGNOSIS — M1712 Unilateral primary osteoarthritis, left knee: Secondary | ICD-10-CM | POA: Diagnosis not present

## 2018-08-02 DIAGNOSIS — G8911 Acute pain due to trauma: Secondary | ICD-10-CM | POA: Diagnosis not present

## 2018-08-02 DIAGNOSIS — M9902 Segmental and somatic dysfunction of thoracic region: Secondary | ICD-10-CM | POA: Diagnosis not present

## 2018-08-02 DIAGNOSIS — M9901 Segmental and somatic dysfunction of cervical region: Secondary | ICD-10-CM | POA: Diagnosis not present

## 2018-08-02 DIAGNOSIS — S62306A Unspecified fracture of fifth metacarpal bone, right hand, initial encounter for closed fracture: Secondary | ICD-10-CM | POA: Diagnosis not present

## 2018-08-04 ENCOUNTER — Ambulatory Visit (INDEPENDENT_AMBULATORY_CARE_PROVIDER_SITE_OTHER): Payer: Medicare HMO

## 2018-08-04 ENCOUNTER — Encounter: Payer: Self-pay | Admitting: Sports Medicine

## 2018-08-04 ENCOUNTER — Other Ambulatory Visit: Payer: Self-pay

## 2018-08-04 ENCOUNTER — Ambulatory Visit (INDEPENDENT_AMBULATORY_CARE_PROVIDER_SITE_OTHER): Payer: Medicare HMO | Admitting: Sports Medicine

## 2018-08-04 DIAGNOSIS — S62326D Displaced fracture of shaft of fifth metacarpal bone, right hand, subsequent encounter for fracture with routine healing: Secondary | ICD-10-CM | POA: Diagnosis not present

## 2018-08-04 DIAGNOSIS — S62336A Displaced fracture of neck of fifth metacarpal bone, right hand, initial encounter for closed fracture: Secondary | ICD-10-CM | POA: Diagnosis not present

## 2018-08-04 DIAGNOSIS — S62306A Unspecified fracture of fifth metacarpal bone, right hand, initial encounter for closed fracture: Secondary | ICD-10-CM | POA: Insufficient documentation

## 2018-08-04 MED ORDER — HYDROCODONE-ACETAMINOPHEN 5-325 MG PO TABS: 1.0000 | ORAL_TABLET | Freq: Three times a day (TID) | ORAL | 0 refills | Status: DC | PRN

## 2018-08-04 NOTE — Progress Notes (Addendum)
Subjective:    CC: Right hand injury  HPI:  4 days ago this pleasant 73 year old female was trying to kill a bug on the countertop, she hit it with her fist, had immediate pain, swelling, bruising in her right hand.  She was seen in Dr. Yetta Barre office where x-rays showed a fracture through the fifth metacarpal neck.  She is here for further evaluation and definitive treatment.  Is moderate, persistent, localized without radiation.  I reviewed the past medical history, family history, social history, surgical history, and allergies today and no changes were needed.  Please see the problem list section below in epic for further details.  Past Medical History: Past Medical History:  Diagnosis Date  . Cancer ([place])   . Depression   . GERD (gastroesophageal reflux disease)   . Hyperlipidemia    Past Surgical History: Past Surgical History:  Procedure Laterality Date  . LAPAROSCOPY     Social History: Social History   Socioeconomic History  . Marital status: Married    Spouse name: Not on file  . Number of children: Not on file  . Years of education: Not on file  . Highest education level: Not on file  Occupational History  . Not on file  Social Needs  . Financial resource strain: Not on file  . Food insecurity:    Worry: Not on file    Inability: Not on file  . Transportation needs:    Medical: Not on file    Non-medical: Not on file  Tobacco Use  . Smoking status: Never Smoker  . Smokeless tobacco: Never Used  Substance and Sexual Activity  . Alcohol use: Yes  . Drug use: No  . Sexual activity: Not on file  Lifestyle  . Physical activity:    Days per week: Not on file    Minutes per session: Not on file  . Stress: Not on file  Relationships  . Social connections:    Talks on phone: Not on file    Gets together: Not on file    Attends religious service: Not on file    Active member of club or organization: Not on file    Attends meetings of clubs or  organizations: Not on file    Relationship status: Not on file  Other Topics Concern  . Not on file  Social History Narrative  . Not on file   Family History: Family History  Problem Relation Age of Onset  . Heart disease Father   . Hyperlipidemia Father   . Hyperlipidemia Sister   . Cancer Sister   . Hyperlipidemia Brother   . Cancer Brother    Allergies: No Known Allergies Medications: See med rec.  Review of Systems: No headache, visual changes, nausea, vomiting, diarrhea, constipation, dizziness, abdominal pain, skin rash, fevers, chills, night sweats, swollen lymph nodes, weight loss, chest pain, body aches, joint swelling, muscle aches, shortness of breath, mood changes, visual or auditory hallucinations.  Objective:    General: Well Developed, well nourished, and in no acute distress.  Neuro: Alert and oriented x3, extra-ocular muscles intact, sensation grossly intact.  HEENT: Normocephalic, atraumatic, pupils equal round reactive to light, neck supple, no masses, no lymphadenopathy, thyroid nonpalpable.  Skin: Warm and dry, no rashes noted.  Cardiac: Regular rate and rhythm, no murmurs rubs or gallops.  Respiratory: Clear to auscultation bilaterally. Not using accessory muscles, speaking in full sentences.  Abdominal: Soft, nontender, nondistended, positive bowel sounds, no masses, no organomegaly.  Musculoskeletal: Right hand  is swollen, bruised, tender at the fifth metacarpal neck  Prereduction x-rays as below     Procedure:  Fracture Reduction   Risks, benefits, and alternatives explained and consent obtained. Time out conducted. Surface prepped with alcohol. 5cc lidocaine infiltrated in a hematoma block. Adequate anesthesia ensured. Fracture reduction: I placed the fifth digit in finger traps for about 5 minutes, then I accentuated the fracture alignment, and reduced it. An ulnar gutter splint was applied. Pt stable, aftercare and follow-up advised.   Postreduction films do not appear significantly changed, she will need percutaneous fixation  Impression and Recommendations:    The patient was counselled, risk factors were discussed, anticipatory guidance given.  Fracture of fifth metacarpal bone of right hand Mildly displaced fifth metacarpal fracture. Closed reduction with hematoma block. Ulnar gutter splint applied. Postreduction x-rays today. Hydrocodone for postprocedural pain, return to see me in 1 week for repeat x-rays.  Postreduction films appear unchanged, I am going to get her set up with hand surgery, she will likely need percutaneous fixation, I will not charge for the reduction attempt.   ___________________________________________ Gwen Her. Dianah Field, M.D., ABFM., CAQSM. Primary Care and Sports Medicine [facility]  Adjunct Professor of [place] of [facility] [employer] of Medicine

## 2018-08-04 NOTE — Assessment & Plan Note (Addendum)
Mildly displaced fifth metacarpal fracture. Closed reduction with hematoma block. Ulnar gutter splint applied. Postreduction x-rays today. Hydrocodone for postprocedural pain, return to see me in 1 week for repeat x-rays.  Postreduction films appear unchanged, I am going to get her set up with hand surgery, she will likely need percutaneous fixation, I will not charge for the reduction attempt.

## 2018-08-04 NOTE — Addendum Note (Signed)
Addended by: Silverio Decamp on: 08/04/2018 11:45 AM   Modules accepted: Orders

## 2018-08-10 DIAGNOSIS — S62336A Displaced fracture of neck of fifth metacarpal bone, right hand, initial encounter for closed fracture: Secondary | ICD-10-CM | POA: Diagnosis not present

## 2018-08-10 DIAGNOSIS — M79641 Pain in right hand: Secondary | ICD-10-CM | POA: Diagnosis not present

## 2018-08-11 ENCOUNTER — Ambulatory Visit: Payer: Medicare HMO | Admitting: Sports Medicine

## 2018-08-11 DIAGNOSIS — S62336A Displaced fracture of neck of fifth metacarpal bone, right hand, initial encounter for closed fracture: Secondary | ICD-10-CM | POA: Diagnosis not present

## 2018-08-11 DIAGNOSIS — Y999 Unspecified external cause status: Secondary | ICD-10-CM | POA: Diagnosis not present

## 2018-08-17 DIAGNOSIS — S62336A Displaced fracture of neck of fifth metacarpal bone, right hand, initial encounter for closed fracture: Secondary | ICD-10-CM | POA: Diagnosis not present

## 2018-08-17 DIAGNOSIS — M79644 Pain in right finger(s): Secondary | ICD-10-CM | POA: Diagnosis not present

## 2018-08-17 DIAGNOSIS — M25641 Stiffness of right hand, not elsewhere classified: Secondary | ICD-10-CM | POA: Diagnosis not present

## 2018-08-19 DIAGNOSIS — M1711 Unilateral primary osteoarthritis, right knee: Secondary | ICD-10-CM | POA: Diagnosis not present

## 2018-08-31 DIAGNOSIS — S62336A Displaced fracture of neck of fifth metacarpal bone, right hand, initial encounter for closed fracture: Secondary | ICD-10-CM | POA: Diagnosis not present

## 2018-09-07 DIAGNOSIS — M9901 Segmental and somatic dysfunction of cervical region: Secondary | ICD-10-CM | POA: Diagnosis not present

## 2018-09-07 DIAGNOSIS — S62306A Unspecified fracture of fifth metacarpal bone, right hand, initial encounter for closed fracture: Secondary | ICD-10-CM | POA: Diagnosis not present

## 2018-09-07 DIAGNOSIS — G8911 Acute pain due to trauma: Secondary | ICD-10-CM | POA: Diagnosis not present

## 2018-09-07 DIAGNOSIS — M9902 Segmental and somatic dysfunction of thoracic region: Secondary | ICD-10-CM | POA: Diagnosis not present

## 2018-09-09 DIAGNOSIS — M1711 Unilateral primary osteoarthritis, right knee: Secondary | ICD-10-CM | POA: Diagnosis not present

## 2018-09-09 DIAGNOSIS — S62336A Displaced fracture of neck of fifth metacarpal bone, right hand, initial encounter for closed fracture: Secondary | ICD-10-CM | POA: Diagnosis not present

## 2018-09-13 DIAGNOSIS — E7800 Pure hypercholesterolemia, unspecified: Secondary | ICD-10-CM | POA: Diagnosis not present

## 2018-09-13 DIAGNOSIS — Z01810 Encounter for preprocedural cardiovascular examination: Secondary | ICD-10-CM | POA: Diagnosis not present

## 2018-09-13 DIAGNOSIS — D649 Anemia, unspecified: Secondary | ICD-10-CM | POA: Diagnosis not present

## 2018-09-13 DIAGNOSIS — R69 Illness, unspecified: Secondary | ICD-10-CM | POA: Diagnosis not present

## 2018-09-13 DIAGNOSIS — Z6825 Body mass index (BMI) 25.0-25.9, adult: Secondary | ICD-10-CM | POA: Diagnosis not present

## 2018-09-13 DIAGNOSIS — K219 Gastro-esophageal reflux disease without esophagitis: Secondary | ICD-10-CM | POA: Diagnosis not present

## 2018-09-13 DIAGNOSIS — M1711 Unilateral primary osteoarthritis, right knee: Secondary | ICD-10-CM | POA: Diagnosis not present

## 2018-09-14 DIAGNOSIS — G8918 Other acute postprocedural pain: Secondary | ICD-10-CM | POA: Diagnosis not present

## 2018-09-14 DIAGNOSIS — M1711 Unilateral primary osteoarthritis, right knee: Secondary | ICD-10-CM | POA: Diagnosis not present

## 2018-09-17 DIAGNOSIS — M25561 Pain in right knee: Secondary | ICD-10-CM | POA: Diagnosis not present

## 2018-09-21 DIAGNOSIS — M25561 Pain in right knee: Secondary | ICD-10-CM | POA: Diagnosis not present

## 2018-09-23 DIAGNOSIS — M25561 Pain in right knee: Secondary | ICD-10-CM | POA: Diagnosis not present

## 2018-09-30 DIAGNOSIS — S62336A Displaced fracture of neck of fifth metacarpal bone, right hand, initial encounter for closed fracture: Secondary | ICD-10-CM | POA: Diagnosis not present

## 2018-10-01 DIAGNOSIS — M25561 Pain in right knee: Secondary | ICD-10-CM | POA: Diagnosis not present

## 2018-10-04 DIAGNOSIS — M25561 Pain in right knee: Secondary | ICD-10-CM | POA: Diagnosis not present

## 2018-10-06 DIAGNOSIS — M25561 Pain in right knee: Secondary | ICD-10-CM | POA: Diagnosis not present

## 2018-10-08 DIAGNOSIS — M25561 Pain in right knee: Secondary | ICD-10-CM | POA: Diagnosis not present

## 2018-10-11 DIAGNOSIS — M25561 Pain in right knee: Secondary | ICD-10-CM | POA: Diagnosis not present

## 2018-10-13 DIAGNOSIS — M25561 Pain in right knee: Secondary | ICD-10-CM | POA: Diagnosis not present

## 2018-10-18 DIAGNOSIS — M25561 Pain in right knee: Secondary | ICD-10-CM | POA: Diagnosis not present

## 2018-10-19 DIAGNOSIS — Z96651 Presence of right artificial knee joint: Secondary | ICD-10-CM | POA: Diagnosis not present

## 2018-10-20 DIAGNOSIS — M25561 Pain in right knee: Secondary | ICD-10-CM | POA: Diagnosis not present

## 2018-10-28 DIAGNOSIS — M9902 Segmental and somatic dysfunction of thoracic region: Secondary | ICD-10-CM | POA: Diagnosis not present

## 2018-10-28 DIAGNOSIS — M9901 Segmental and somatic dysfunction of cervical region: Secondary | ICD-10-CM | POA: Diagnosis not present

## 2018-10-28 DIAGNOSIS — M9903 Segmental and somatic dysfunction of lumbar region: Secondary | ICD-10-CM | POA: Diagnosis not present

## 2018-11-03 DIAGNOSIS — M9903 Segmental and somatic dysfunction of lumbar region: Secondary | ICD-10-CM | POA: Diagnosis not present

## 2018-11-03 DIAGNOSIS — M9901 Segmental and somatic dysfunction of cervical region: Secondary | ICD-10-CM | POA: Diagnosis not present

## 2018-11-03 DIAGNOSIS — M9902 Segmental and somatic dysfunction of thoracic region: Secondary | ICD-10-CM | POA: Diagnosis not present

## 2018-11-03 DIAGNOSIS — M9904 Segmental and somatic dysfunction of sacral region: Secondary | ICD-10-CM | POA: Diagnosis not present

## 2018-11-09 DIAGNOSIS — Z85820 Personal history of malignant melanoma of skin: Secondary | ICD-10-CM | POA: Diagnosis not present

## 2018-11-09 DIAGNOSIS — M9901 Segmental and somatic dysfunction of cervical region: Secondary | ICD-10-CM | POA: Diagnosis not present

## 2018-11-09 DIAGNOSIS — C44319 Basal cell carcinoma of skin of other parts of face: Secondary | ICD-10-CM | POA: Diagnosis not present

## 2018-11-09 DIAGNOSIS — C44519 Basal cell carcinoma of skin of other part of trunk: Secondary | ICD-10-CM | POA: Diagnosis not present

## 2018-11-09 DIAGNOSIS — M9903 Segmental and somatic dysfunction of lumbar region: Secondary | ICD-10-CM | POA: Diagnosis not present

## 2018-11-09 DIAGNOSIS — X32XXXD Exposure to sunlight, subsequent encounter: Secondary | ICD-10-CM | POA: Diagnosis not present

## 2018-11-09 DIAGNOSIS — M9904 Segmental and somatic dysfunction of sacral region: Secondary | ICD-10-CM | POA: Diagnosis not present

## 2018-11-09 DIAGNOSIS — Z08 Encounter for follow-up examination after completed treatment for malignant neoplasm: Secondary | ICD-10-CM | POA: Diagnosis not present

## 2018-11-09 DIAGNOSIS — M9902 Segmental and somatic dysfunction of thoracic region: Secondary | ICD-10-CM | POA: Diagnosis not present

## 2018-11-09 DIAGNOSIS — Z1283 Encounter for screening for malignant neoplasm of skin: Secondary | ICD-10-CM | POA: Diagnosis not present

## 2018-11-09 DIAGNOSIS — L570 Actinic keratosis: Secondary | ICD-10-CM | POA: Diagnosis not present

## 2018-11-09 DIAGNOSIS — D225 Melanocytic nevi of trunk: Secondary | ICD-10-CM | POA: Diagnosis not present

## 2018-11-23 DIAGNOSIS — D649 Anemia, unspecified: Secondary | ICD-10-CM | POA: Diagnosis not present

## 2018-11-23 DIAGNOSIS — E7800 Pure hypercholesterolemia, unspecified: Secondary | ICD-10-CM | POA: Diagnosis not present

## 2018-11-29 DIAGNOSIS — M9903 Segmental and somatic dysfunction of lumbar region: Secondary | ICD-10-CM | POA: Diagnosis not present

## 2018-11-29 DIAGNOSIS — M9901 Segmental and somatic dysfunction of cervical region: Secondary | ICD-10-CM | POA: Diagnosis not present

## 2018-11-29 DIAGNOSIS — M9904 Segmental and somatic dysfunction of sacral region: Secondary | ICD-10-CM | POA: Diagnosis not present

## 2018-11-29 DIAGNOSIS — M9902 Segmental and somatic dysfunction of thoracic region: Secondary | ICD-10-CM | POA: Diagnosis not present

## 2018-12-21 DIAGNOSIS — M9902 Segmental and somatic dysfunction of thoracic region: Secondary | ICD-10-CM | POA: Diagnosis not present

## 2018-12-21 DIAGNOSIS — M9901 Segmental and somatic dysfunction of cervical region: Secondary | ICD-10-CM | POA: Diagnosis not present

## 2018-12-21 DIAGNOSIS — M9903 Segmental and somatic dysfunction of lumbar region: Secondary | ICD-10-CM | POA: Diagnosis not present

## 2018-12-21 DIAGNOSIS — M9904 Segmental and somatic dysfunction of sacral region: Secondary | ICD-10-CM | POA: Diagnosis not present

## 2018-12-27 DIAGNOSIS — M9904 Segmental and somatic dysfunction of sacral region: Secondary | ICD-10-CM | POA: Diagnosis not present

## 2018-12-27 DIAGNOSIS — M9903 Segmental and somatic dysfunction of lumbar region: Secondary | ICD-10-CM | POA: Diagnosis not present

## 2018-12-27 DIAGNOSIS — M9901 Segmental and somatic dysfunction of cervical region: Secondary | ICD-10-CM | POA: Diagnosis not present

## 2018-12-27 DIAGNOSIS — M9902 Segmental and somatic dysfunction of thoracic region: Secondary | ICD-10-CM | POA: Diagnosis not present

## 2019-01-25 DIAGNOSIS — R69 Illness, unspecified: Secondary | ICD-10-CM | POA: Diagnosis not present

## 2019-02-07 DIAGNOSIS — M545 Low back pain: Secondary | ICD-10-CM | POA: Diagnosis not present

## 2019-02-07 DIAGNOSIS — M542 Cervicalgia: Secondary | ICD-10-CM | POA: Diagnosis not present

## 2019-02-07 DIAGNOSIS — M546 Pain in thoracic spine: Secondary | ICD-10-CM | POA: Diagnosis not present

## 2019-02-07 DIAGNOSIS — M9901 Segmental and somatic dysfunction of cervical region: Secondary | ICD-10-CM | POA: Diagnosis not present

## 2019-02-09 DIAGNOSIS — H52203 Unspecified astigmatism, bilateral: Secondary | ICD-10-CM | POA: Diagnosis not present

## 2019-02-09 DIAGNOSIS — H2513 Age-related nuclear cataract, bilateral: Secondary | ICD-10-CM | POA: Diagnosis not present

## 2019-03-08 DIAGNOSIS — H2511 Age-related nuclear cataract, right eye: Secondary | ICD-10-CM | POA: Diagnosis not present

## 2019-03-11 DIAGNOSIS — L820 Inflamed seborrheic keratosis: Secondary | ICD-10-CM | POA: Diagnosis not present

## 2019-03-11 DIAGNOSIS — X32XXXD Exposure to sunlight, subsequent encounter: Secondary | ICD-10-CM | POA: Diagnosis not present

## 2019-03-11 DIAGNOSIS — Z08 Encounter for follow-up examination after completed treatment for malignant neoplasm: Secondary | ICD-10-CM | POA: Diagnosis not present

## 2019-03-11 DIAGNOSIS — L570 Actinic keratosis: Secondary | ICD-10-CM | POA: Diagnosis not present

## 2019-03-11 DIAGNOSIS — L821 Other seborrheic keratosis: Secondary | ICD-10-CM | POA: Diagnosis not present

## 2019-03-11 DIAGNOSIS — Z1283 Encounter for screening for malignant neoplasm of skin: Secondary | ICD-10-CM | POA: Diagnosis not present

## 2019-03-11 DIAGNOSIS — Z85828 Personal history of other malignant neoplasm of skin: Secondary | ICD-10-CM | POA: Diagnosis not present

## 2019-04-01 DIAGNOSIS — M542 Cervicalgia: Secondary | ICD-10-CM | POA: Diagnosis not present

## 2019-04-01 DIAGNOSIS — M9901 Segmental and somatic dysfunction of cervical region: Secondary | ICD-10-CM | POA: Diagnosis not present

## 2019-04-01 DIAGNOSIS — M546 Pain in thoracic spine: Secondary | ICD-10-CM | POA: Diagnosis not present

## 2019-04-01 DIAGNOSIS — M545 Low back pain: Secondary | ICD-10-CM | POA: Diagnosis not present

## 2019-04-13 DIAGNOSIS — N950 Postmenopausal bleeding: Secondary | ICD-10-CM | POA: Diagnosis not present

## 2019-05-03 DIAGNOSIS — H2512 Age-related nuclear cataract, left eye: Secondary | ICD-10-CM | POA: Diagnosis not present

## 2019-05-03 DIAGNOSIS — H25812 Combined forms of age-related cataract, left eye: Secondary | ICD-10-CM | POA: Diagnosis not present

## 2019-05-26 DIAGNOSIS — M542 Cervicalgia: Secondary | ICD-10-CM | POA: Diagnosis not present

## 2019-05-26 DIAGNOSIS — M545 Low back pain: Secondary | ICD-10-CM | POA: Diagnosis not present

## 2019-05-26 DIAGNOSIS — M9901 Segmental and somatic dysfunction of cervical region: Secondary | ICD-10-CM | POA: Diagnosis not present

## 2019-05-26 DIAGNOSIS — M546 Pain in thoracic spine: Secondary | ICD-10-CM | POA: Diagnosis not present

## 2019-06-08 DIAGNOSIS — Z961 Presence of intraocular lens: Secondary | ICD-10-CM | POA: Diagnosis not present

## 2019-06-28 DIAGNOSIS — M9901 Segmental and somatic dysfunction of cervical region: Secondary | ICD-10-CM | POA: Diagnosis not present

## 2019-06-28 DIAGNOSIS — M545 Low back pain: Secondary | ICD-10-CM | POA: Diagnosis not present

## 2019-06-28 DIAGNOSIS — M9902 Segmental and somatic dysfunction of thoracic region: Secondary | ICD-10-CM | POA: Diagnosis not present

## 2019-06-28 DIAGNOSIS — M546 Pain in thoracic spine: Secondary | ICD-10-CM | POA: Diagnosis not present

## 2019-06-29 DIAGNOSIS — N958 Other specified menopausal and perimenopausal disorders: Secondary | ICD-10-CM | POA: Diagnosis not present

## 2019-06-29 DIAGNOSIS — R87615 Unsatisfactory cytologic smear of cervix: Secondary | ICD-10-CM | POA: Diagnosis not present

## 2019-06-29 DIAGNOSIS — Z124 Encounter for screening for malignant neoplasm of cervix: Secondary | ICD-10-CM | POA: Diagnosis not present

## 2019-06-29 DIAGNOSIS — Z6824 Body mass index (BMI) 24.0-24.9, adult: Secondary | ICD-10-CM | POA: Diagnosis not present

## 2019-06-29 DIAGNOSIS — Z1231 Encounter for screening mammogram for malignant neoplasm of breast: Secondary | ICD-10-CM | POA: Diagnosis not present

## 2019-06-29 DIAGNOSIS — M816 Localized osteoporosis [Lequesne]: Secondary | ICD-10-CM | POA: Diagnosis not present

## 2019-08-05 DIAGNOSIS — Z8342 Family history of familial hypercholesterolemia: Secondary | ICD-10-CM | POA: Diagnosis not present

## 2019-08-05 DIAGNOSIS — M810 Age-related osteoporosis without current pathological fracture: Secondary | ICD-10-CM | POA: Diagnosis not present

## 2019-08-11 DIAGNOSIS — M9901 Segmental and somatic dysfunction of cervical region: Secondary | ICD-10-CM | POA: Diagnosis not present

## 2019-08-11 DIAGNOSIS — M545 Low back pain: Secondary | ICD-10-CM | POA: Diagnosis not present

## 2019-08-11 DIAGNOSIS — M9902 Segmental and somatic dysfunction of thoracic region: Secondary | ICD-10-CM | POA: Diagnosis not present

## 2019-08-11 DIAGNOSIS — Z9189 Other specified personal risk factors, not elsewhere classified: Secondary | ICD-10-CM | POA: Diagnosis not present

## 2019-08-11 DIAGNOSIS — M810 Age-related osteoporosis without current pathological fracture: Secondary | ICD-10-CM | POA: Diagnosis not present

## 2019-08-11 DIAGNOSIS — R7401 Elevation of levels of liver transaminase levels: Secondary | ICD-10-CM | POA: Diagnosis not present

## 2019-08-11 DIAGNOSIS — M546 Pain in thoracic spine: Secondary | ICD-10-CM | POA: Diagnosis not present

## 2019-08-11 DIAGNOSIS — E079 Disorder of thyroid, unspecified: Secondary | ICD-10-CM | POA: Diagnosis not present

## 2019-08-22 DIAGNOSIS — K219 Gastro-esophageal reflux disease without esophagitis: Secondary | ICD-10-CM | POA: Diagnosis not present

## 2019-08-22 DIAGNOSIS — M810 Age-related osteoporosis without current pathological fracture: Secondary | ICD-10-CM | POA: Diagnosis not present

## 2019-08-22 DIAGNOSIS — R69 Illness, unspecified: Secondary | ICD-10-CM | POA: Diagnosis not present

## 2019-08-22 DIAGNOSIS — Z6825 Body mass index (BMI) 25.0-25.9, adult: Secondary | ICD-10-CM | POA: Diagnosis not present

## 2019-08-22 DIAGNOSIS — M4802 Spinal stenosis, cervical region: Secondary | ICD-10-CM | POA: Diagnosis not present

## 2019-08-22 DIAGNOSIS — E7800 Pure hypercholesterolemia, unspecified: Secondary | ICD-10-CM | POA: Diagnosis not present

## 2019-08-29 DIAGNOSIS — M9902 Segmental and somatic dysfunction of thoracic region: Secondary | ICD-10-CM | POA: Diagnosis not present

## 2019-08-29 DIAGNOSIS — M546 Pain in thoracic spine: Secondary | ICD-10-CM | POA: Diagnosis not present

## 2019-08-29 DIAGNOSIS — M545 Low back pain: Secondary | ICD-10-CM | POA: Diagnosis not present

## 2019-08-29 DIAGNOSIS — M9901 Segmental and somatic dysfunction of cervical region: Secondary | ICD-10-CM | POA: Diagnosis not present

## 2019-09-12 DIAGNOSIS — H6121 Impacted cerumen, right ear: Secondary | ICD-10-CM | POA: Diagnosis not present

## 2019-09-12 DIAGNOSIS — H6980 Other specified disorders of Eustachian tube, unspecified ear: Secondary | ICD-10-CM | POA: Diagnosis not present

## 2019-09-13 DIAGNOSIS — R69 Illness, unspecified: Secondary | ICD-10-CM | POA: Diagnosis not present

## 2019-09-19 DIAGNOSIS — M545 Low back pain: Secondary | ICD-10-CM | POA: Diagnosis not present

## 2019-09-19 DIAGNOSIS — M546 Pain in thoracic spine: Secondary | ICD-10-CM | POA: Diagnosis not present

## 2019-09-19 DIAGNOSIS — M9901 Segmental and somatic dysfunction of cervical region: Secondary | ICD-10-CM | POA: Diagnosis not present

## 2019-09-19 DIAGNOSIS — M9902 Segmental and somatic dysfunction of thoracic region: Secondary | ICD-10-CM | POA: Diagnosis not present

## 2019-10-26 DIAGNOSIS — M9901 Segmental and somatic dysfunction of cervical region: Secondary | ICD-10-CM | POA: Diagnosis not present

## 2019-10-26 DIAGNOSIS — M546 Pain in thoracic spine: Secondary | ICD-10-CM | POA: Diagnosis not present

## 2019-10-26 DIAGNOSIS — M9902 Segmental and somatic dysfunction of thoracic region: Secondary | ICD-10-CM | POA: Diagnosis not present

## 2019-10-26 DIAGNOSIS — M545 Low back pain: Secondary | ICD-10-CM | POA: Diagnosis not present

## 2019-11-10 DIAGNOSIS — M545 Low back pain: Secondary | ICD-10-CM | POA: Diagnosis not present

## 2019-11-10 DIAGNOSIS — M546 Pain in thoracic spine: Secondary | ICD-10-CM | POA: Diagnosis not present

## 2019-11-10 DIAGNOSIS — Z96651 Presence of right artificial knee joint: Secondary | ICD-10-CM | POA: Diagnosis not present

## 2019-11-10 DIAGNOSIS — M9902 Segmental and somatic dysfunction of thoracic region: Secondary | ICD-10-CM | POA: Diagnosis not present

## 2019-11-10 DIAGNOSIS — M9901 Segmental and somatic dysfunction of cervical region: Secondary | ICD-10-CM | POA: Diagnosis not present

## 2019-11-25 DIAGNOSIS — M546 Pain in thoracic spine: Secondary | ICD-10-CM | POA: Diagnosis not present

## 2019-11-25 DIAGNOSIS — M9901 Segmental and somatic dysfunction of cervical region: Secondary | ICD-10-CM | POA: Diagnosis not present

## 2019-11-25 DIAGNOSIS — M545 Low back pain: Secondary | ICD-10-CM | POA: Diagnosis not present

## 2019-11-25 DIAGNOSIS — M9902 Segmental and somatic dysfunction of thoracic region: Secondary | ICD-10-CM | POA: Diagnosis not present

## 2019-11-28 DIAGNOSIS — M546 Pain in thoracic spine: Secondary | ICD-10-CM | POA: Diagnosis not present

## 2019-11-28 DIAGNOSIS — M9902 Segmental and somatic dysfunction of thoracic region: Secondary | ICD-10-CM | POA: Diagnosis not present

## 2019-11-28 DIAGNOSIS — M545 Low back pain: Secondary | ICD-10-CM | POA: Diagnosis not present

## 2019-11-28 DIAGNOSIS — M9901 Segmental and somatic dysfunction of cervical region: Secondary | ICD-10-CM | POA: Diagnosis not present

## 2019-11-29 DIAGNOSIS — M9901 Segmental and somatic dysfunction of cervical region: Secondary | ICD-10-CM | POA: Diagnosis not present

## 2019-11-29 DIAGNOSIS — M546 Pain in thoracic spine: Secondary | ICD-10-CM | POA: Diagnosis not present

## 2019-11-29 DIAGNOSIS — M545 Low back pain: Secondary | ICD-10-CM | POA: Diagnosis not present

## 2019-11-29 DIAGNOSIS — M9902 Segmental and somatic dysfunction of thoracic region: Secondary | ICD-10-CM | POA: Diagnosis not present

## 2019-12-08 DIAGNOSIS — Z136 Encounter for screening for cardiovascular disorders: Secondary | ICD-10-CM | POA: Diagnosis not present

## 2019-12-08 DIAGNOSIS — Z131 Encounter for screening for diabetes mellitus: Secondary | ICD-10-CM | POA: Diagnosis not present

## 2019-12-08 DIAGNOSIS — Z0000 Encounter for general adult medical examination without abnormal findings: Secondary | ICD-10-CM | POA: Diagnosis not present

## 2019-12-08 DIAGNOSIS — Z1389 Encounter for screening for other disorder: Secondary | ICD-10-CM | POA: Diagnosis not present

## 2019-12-08 DIAGNOSIS — Z23 Encounter for immunization: Secondary | ICD-10-CM | POA: Diagnosis not present

## 2019-12-21 DIAGNOSIS — M9902 Segmental and somatic dysfunction of thoracic region: Secondary | ICD-10-CM | POA: Diagnosis not present

## 2019-12-21 DIAGNOSIS — M545 Low back pain, unspecified: Secondary | ICD-10-CM | POA: Diagnosis not present

## 2019-12-21 DIAGNOSIS — M9901 Segmental and somatic dysfunction of cervical region: Secondary | ICD-10-CM | POA: Diagnosis not present

## 2019-12-21 DIAGNOSIS — M546 Pain in thoracic spine: Secondary | ICD-10-CM | POA: Diagnosis not present

## 2020-02-03 DIAGNOSIS — M9902 Segmental and somatic dysfunction of thoracic region: Secondary | ICD-10-CM | POA: Diagnosis not present

## 2020-02-03 DIAGNOSIS — M9903 Segmental and somatic dysfunction of lumbar region: Secondary | ICD-10-CM | POA: Diagnosis not present

## 2020-02-03 DIAGNOSIS — M9901 Segmental and somatic dysfunction of cervical region: Secondary | ICD-10-CM | POA: Diagnosis not present

## 2020-02-03 DIAGNOSIS — M9904 Segmental and somatic dysfunction of sacral region: Secondary | ICD-10-CM | POA: Diagnosis not present

## 2020-02-13 DIAGNOSIS — M9904 Segmental and somatic dysfunction of sacral region: Secondary | ICD-10-CM | POA: Diagnosis not present

## 2020-02-13 DIAGNOSIS — M9903 Segmental and somatic dysfunction of lumbar region: Secondary | ICD-10-CM | POA: Diagnosis not present

## 2020-02-13 DIAGNOSIS — M9902 Segmental and somatic dysfunction of thoracic region: Secondary | ICD-10-CM | POA: Diagnosis not present

## 2020-02-13 DIAGNOSIS — M9901 Segmental and somatic dysfunction of cervical region: Secondary | ICD-10-CM | POA: Diagnosis not present

## 2020-02-14 ENCOUNTER — Ambulatory Visit (INDEPENDENT_AMBULATORY_CARE_PROVIDER_SITE_OTHER): Payer: Medicare HMO

## 2020-02-14 ENCOUNTER — Other Ambulatory Visit: Payer: Self-pay

## 2020-02-14 ENCOUNTER — Ambulatory Visit (INDEPENDENT_AMBULATORY_CARE_PROVIDER_SITE_OTHER): Payer: Medicare HMO | Admitting: Sports Medicine

## 2020-02-14 DIAGNOSIS — G8929 Other chronic pain: Secondary | ICD-10-CM

## 2020-02-14 DIAGNOSIS — M5412 Radiculopathy, cervical region: Secondary | ICD-10-CM | POA: Diagnosis not present

## 2020-02-14 DIAGNOSIS — M19012 Primary osteoarthritis, left shoulder: Secondary | ICD-10-CM | POA: Diagnosis not present

## 2020-02-14 DIAGNOSIS — M542 Cervicalgia: Secondary | ICD-10-CM

## 2020-02-14 DIAGNOSIS — M25512 Pain in left shoulder: Secondary | ICD-10-CM

## 2020-02-14 MED ORDER — GABAPENTIN 300 MG PO CAPS: ORAL_CAPSULE | ORAL | 3 refills | Status: DC

## 2020-02-14 NOTE — Assessment & Plan Note (Signed)
Multifactorial left shoulder pain, likely coming from some impingement signs, glenohumeral osteoarthritis as well as her neck. She has done greater than 6 weeks of conservative treatment with chiropractic therapy, today we injected her subacromial bursa and glenohumeral joint, getting x-rays of her left shoulder, formal physical therapy. Return to see me in 6 weeks for this.

## 2020-02-14 NOTE — Progress Notes (Signed)
    Procedures performed today:    Procedure: Real-time Ultrasound Guided injection of the left subacromial bursa Device: Samsung HS60  Verbal informed consent obtained.  Time-out conducted.  Noted no overlying erythema, induration, or other signs of local infection.  Skin prepped in a sterile fashion.  Local anesthesia: Topical Ethyl chloride.  With sterile technique and under real time ultrasound guidance: 1 cc Kenalog 40, 1 cc lidocaine, 1 cc bupivacaine injected easily Completed without difficulty  Advised to call if fevers/chills, erythema, induration, drainage, or persistent bleeding.  Images permanently stored and available for review in PACS.  Impression: Technically successful ultrasound guided injection.  Procedure: Real-time Ultrasound Guided injection of the left glenohumeral joint Device: Samsung HS60  Verbal informed consent obtained.  Time-out conducted.  Noted no overlying erythema, induration, or other signs of local infection.  Skin prepped in a sterile fashion.  Local anesthesia: Topical Ethyl chloride.  With sterile technique and under real time ultrasound guidance: 1 cc Kenalog 40, 2 cc lidocaine, 2 cc bupivacaine injected easily Completed without difficulty  Advised to call if fevers/chills, erythema, induration, drainage, or persistent bleeding.  Images permanently stored and available for review in PACS.  Impression: Technically successful ultrasound guided injection.  Independent interpretation of notes and tests performed by another provider:   None.  Brief History, Exam, Impression, and Recommendations:    Chronic left shoulder pain Multifactorial left shoulder pain, likely coming from some impingement signs, glenohumeral osteoarthritis as well as her neck. She has done greater than 6 weeks of conservative treatment with chiropractic therapy, today we injected her subacromial bursa and glenohumeral joint, getting x-rays of her left shoulder, formal  physical therapy. Return to see me in 6 weeks for this.  Radiculitis of left cervical region Whitney Monroe is also having chronic neck pain with left-sided periscapular radicular symptoms. She has done greater than 6 weeks of conservative treatment including chiropractic manipulation, adding x-rays, formal physical therapy, gabapentin. Because of failure of 6 weeks of conservative therapy we are also going to add a cervical spine MRI, for epidural planning.    ___________________________________________ Gwen Her. Dianah Field, M.D., ABFM., CAQSM. Primary Care and [place] Instructor of [place] of [facility] of Medicine

## 2020-02-14 NOTE — Assessment & Plan Note (Signed)
Whitney Monroe is also having chronic neck pain with left-sided periscapular radicular symptoms. She has done greater than 6 weeks of conservative treatment including chiropractic manipulation, adding x-rays, formal physical therapy, gabapentin. Because of failure of 6 weeks of conservative therapy we are also going to add a cervical spine MRI, for epidural planning.

## 2020-02-20 DIAGNOSIS — M9904 Segmental and somatic dysfunction of sacral region: Secondary | ICD-10-CM | POA: Diagnosis not present

## 2020-02-20 DIAGNOSIS — M9901 Segmental and somatic dysfunction of cervical region: Secondary | ICD-10-CM | POA: Diagnosis not present

## 2020-02-20 DIAGNOSIS — M9903 Segmental and somatic dysfunction of lumbar region: Secondary | ICD-10-CM | POA: Diagnosis not present

## 2020-02-20 DIAGNOSIS — M9902 Segmental and somatic dysfunction of thoracic region: Secondary | ICD-10-CM | POA: Diagnosis not present

## 2020-02-22 ENCOUNTER — Other Ambulatory Visit: Payer: Self-pay

## 2020-02-22 ENCOUNTER — Ambulatory Visit (INDEPENDENT_AMBULATORY_CARE_PROVIDER_SITE_OTHER): Payer: Medicare HMO | Admitting: Sports Medicine

## 2020-02-22 DIAGNOSIS — M5412 Radiculopathy, cervical region: Secondary | ICD-10-CM

## 2020-02-22 DIAGNOSIS — G8929 Other chronic pain: Secondary | ICD-10-CM

## 2020-02-22 DIAGNOSIS — M25512 Pain in left shoulder: Secondary | ICD-10-CM | POA: Diagnosis not present

## 2020-02-22 NOTE — Assessment & Plan Note (Addendum)
Continues to have chronic neck pain, she has had chiropractic manipulation, ultimately we need an MRI, this was ordered and denied by insurance company for no good reason. She will continue her gabapentin up taper, currently doing it twice daily, she did get a phone call from physical therapy but told them she did not need them, we are going to place the referral again. After an additional 6 weeks of physical therapy we will consider revisiting the MRI for epidural planning.

## 2020-02-22 NOTE — Assessment & Plan Note (Signed)
Whitney Monroe has multifactorial left shoulder pain likely coming from impingement syndrome, glenohumeral osteoarthritis as well as her neck, unfortunately she did not respond well to a glenohumeral or subacromial injection at the last visit. I would like her to do some additional physical therapy before considering MRI for surgical planning.

## 2020-02-22 NOTE — Progress Notes (Signed)
    Procedures performed today:    None.  Independent interpretation of notes and tests performed by another provider:   None.  Brief History, Exam, Impression, and Recommendations:    Chronic left shoulder pain Nakesha has multifactorial left shoulder pain likely coming from impingement syndrome, glenohumeral osteoarthritis as well as her neck, unfortunately she did not respond well to a glenohumeral or subacromial injection at the last visit. I would like her to do some additional physical therapy before considering MRI for surgical planning.  Radiculitis of left cervical region Continues to have chronic neck pain, she has had chiropractic manipulation, ultimately we need an MRI, this was ordered and denied by insurance company for no good reason. She will continue her gabapentin up taper, currently doing it twice daily, she did get a phone call from physical therapy but told them she did not need them, we are going to place the referral again. After an additional 6 weeks of physical therapy we will consider revisiting the MRI for epidural planning.     ___________________________________________ Gwen Her. Dianah Field, M.D., ABFM., CAQSM. Primary Care and [place] Instructor of [place] of [facility] of Medicine

## 2020-02-27 DIAGNOSIS — M9901 Segmental and somatic dysfunction of cervical region: Secondary | ICD-10-CM | POA: Diagnosis not present

## 2020-02-27 DIAGNOSIS — M9903 Segmental and somatic dysfunction of lumbar region: Secondary | ICD-10-CM | POA: Diagnosis not present

## 2020-02-27 DIAGNOSIS — M9902 Segmental and somatic dysfunction of thoracic region: Secondary | ICD-10-CM | POA: Diagnosis not present

## 2020-02-27 DIAGNOSIS — M9904 Segmental and somatic dysfunction of sacral region: Secondary | ICD-10-CM | POA: Diagnosis not present

## 2020-02-28 ENCOUNTER — Telehealth: Payer: Self-pay

## 2020-02-28 DIAGNOSIS — S52572A Other intraarticular fracture of lower end of left radius, initial encounter for closed fracture: Secondary | ICD-10-CM | POA: Diagnosis not present

## 2020-02-28 DIAGNOSIS — M25532 Pain in left wrist: Secondary | ICD-10-CM | POA: Diagnosis not present

## 2020-02-28 MED ORDER — HYDROCODONE-ACETAMINOPHEN 10-325 MG PO TABS: 1.0000 | ORAL_TABLET | Freq: Three times a day (TID) | ORAL | 0 refills | Status: AC | PRN

## 2020-02-28 NOTE — Telephone Encounter (Signed)
Adding high-dose hydrocodone.

## 2020-02-28 NOTE — Telephone Encounter (Signed)
Whitney Monroe is calling about left shoulder pain. She states she is in a lot of pain and would like something called in. Please advise.

## 2020-02-29 ENCOUNTER — Ambulatory Visit: Payer: Medicare HMO | Admitting: Sports Medicine

## 2020-02-29 NOTE — Telephone Encounter (Signed)
LVM advising patient of new medication available at the pharmacy.

## 2020-03-01 ENCOUNTER — Emergency Department (HOSPITAL_COMMUNITY): Payer: Medicare HMO

## 2020-03-01 ENCOUNTER — Telehealth: Payer: Self-pay

## 2020-03-01 ENCOUNTER — Emergency Department (HOSPITAL_COMMUNITY)
Admission: EM | Admit: 2020-03-01 | Discharge: 2020-03-01 | Disposition: A | Discharge status: Home / Self Care | Payer: Medicare HMO | Attending: Emergency Medicine | Admitting: Emergency Medicine

## 2020-03-01 ENCOUNTER — Other Ambulatory Visit: Payer: Self-pay

## 2020-03-01 DIAGNOSIS — Z859 Personal history of malignant neoplasm, unspecified: Secondary | ICD-10-CM | POA: Insufficient documentation

## 2020-03-01 DIAGNOSIS — I672 Cerebral atherosclerosis: Secondary | ICD-10-CM | POA: Diagnosis not present

## 2020-03-01 DIAGNOSIS — G319 Degenerative disease of nervous system, unspecified: Secondary | ICD-10-CM | POA: Diagnosis not present

## 2020-03-01 DIAGNOSIS — M47812 Spondylosis without myelopathy or radiculopathy, cervical region: Secondary | ICD-10-CM | POA: Diagnosis not present

## 2020-03-01 DIAGNOSIS — M542 Cervicalgia: Secondary | ICD-10-CM | POA: Diagnosis not present

## 2020-03-01 DIAGNOSIS — S5012XA Contusion of left forearm, initial encounter: Secondary | ICD-10-CM | POA: Insufficient documentation

## 2020-03-01 DIAGNOSIS — S0990XA Unspecified injury of head, initial encounter: Secondary | ICD-10-CM | POA: Insufficient documentation

## 2020-03-01 DIAGNOSIS — W19XXXA Unspecified fall, initial encounter: Secondary | ICD-10-CM | POA: Diagnosis not present

## 2020-03-01 NOTE — Discharge Instructions (Addendum)
You have been seen here for neck pain. I recommend taking over-the-counter pain medications like ibuprofen and/or Tylenol every 6 as needed.  Please follow dosage and on the back of bottle.  I also recommend applying heat to the area and stretching out the muscles as this will help decrease stiffness and pain.  I have given you information on exercises please follow.  I recommend you follow-up with neurosurgery for further evaluation of your neck.  Come back to the emergency department if you develop chest pain, shortness of breath, severe abdominal pain, uncontrolled nausea, vomiting, diarrhea.

## 2020-03-01 NOTE — Telephone Encounter (Signed)
FYI - Patient left message on VM at 1037a requesting a call back about medications. At 1210a patient checked in the ER at [facility] for neck pain. Did not call patient as she was being seen at a [facility].

## 2020-03-01 NOTE — ED Triage Notes (Signed)
Pt states she is seeing a chiropractor and sports medicine doc for her ongoing neck and L shoulder pain but she has not been able to get any relief. States she also fell on Sunday and broke her L wrist and is having surgery on that on Wednesday. Pt states it is supposed to have a splint on but she misplaced it. Taking hydrocodone and tried gabapentin at home but it "made her crazy". Alert and oriented.
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
## 2020-03-05 DIAGNOSIS — U071 COVID-19: Secondary | ICD-10-CM | POA: Diagnosis not present

## 2020-03-06 ENCOUNTER — Ambulatory Visit: Payer: Medicare HMO | Admitting: Sports Medicine

## 2020-03-20 ENCOUNTER — Ambulatory Visit: Payer: Medicare HMO | Admitting: Physical Therapy

## 2020-03-20 ENCOUNTER — Encounter (HOSPITAL_BASED_OUTPATIENT_CLINIC_OR_DEPARTMENT_OTHER): Payer: Self-pay | Admitting: Orthopedic Surgery

## 2020-03-21 ENCOUNTER — Encounter (HOSPITAL_BASED_OUTPATIENT_CLINIC_OR_DEPARTMENT_OTHER): Payer: Self-pay | Admitting: Orthopedic Surgery

## 2020-03-21 ENCOUNTER — Other Ambulatory Visit: Payer: Self-pay | Admitting: Orthopedic Surgery

## 2020-03-21 ENCOUNTER — Other Ambulatory Visit: Payer: Self-pay

## 2020-03-21 DIAGNOSIS — M4802 Spinal stenosis, cervical region: Secondary | ICD-10-CM | POA: Diagnosis not present

## 2020-03-21 DIAGNOSIS — K219 Gastro-esophageal reflux disease without esophagitis: Secondary | ICD-10-CM | POA: Diagnosis not present

## 2020-03-21 DIAGNOSIS — R69 Illness, unspecified: Secondary | ICD-10-CM | POA: Diagnosis not present

## 2020-03-21 DIAGNOSIS — M9904 Segmental and somatic dysfunction of sacral region: Secondary | ICD-10-CM | POA: Diagnosis not present

## 2020-03-21 DIAGNOSIS — M810 Age-related osteoporosis without current pathological fracture: Secondary | ICD-10-CM | POA: Diagnosis not present

## 2020-03-21 DIAGNOSIS — E7800 Pure hypercholesterolemia, unspecified: Secondary | ICD-10-CM | POA: Diagnosis not present

## 2020-03-21 DIAGNOSIS — M9902 Segmental and somatic dysfunction of thoracic region: Secondary | ICD-10-CM | POA: Diagnosis not present

## 2020-03-21 DIAGNOSIS — M9903 Segmental and somatic dysfunction of lumbar region: Secondary | ICD-10-CM | POA: Diagnosis not present

## 2020-03-21 DIAGNOSIS — M9901 Segmental and somatic dysfunction of cervical region: Secondary | ICD-10-CM | POA: Diagnosis not present

## 2020-03-22 DIAGNOSIS — M9901 Segmental and somatic dysfunction of cervical region: Secondary | ICD-10-CM | POA: Diagnosis not present

## 2020-03-22 DIAGNOSIS — M9903 Segmental and somatic dysfunction of lumbar region: Secondary | ICD-10-CM | POA: Diagnosis not present

## 2020-03-22 DIAGNOSIS — M9904 Segmental and somatic dysfunction of sacral region: Secondary | ICD-10-CM | POA: Diagnosis not present

## 2020-03-22 DIAGNOSIS — M9902 Segmental and somatic dysfunction of thoracic region: Secondary | ICD-10-CM | POA: Diagnosis not present

## 2020-03-22 NOTE — Anesthesia Preprocedure Evaluation (Addendum)
Anesthesia Evaluation  Patient identified by MRN, date of birth, ID band Patient awake    Reviewed: Allergy & Precautions, NPO status , Patient's Chart, lab work & pertinent test results  History of Anesthesia Complications Negative for: history of anesthetic complications  Airway Mallampati: II  TM Distance: >3 FB Neck ROM: Full    Dental no notable dental hx.    Pulmonary neg pulmonary ROS,    Pulmonary exam normal        Cardiovascular negative cardio ROS Normal cardiovascular exam     Neuro/Psych Anxiety Depression negative neurological ROS     GI/Hepatic Neg liver ROS, GERD  Medicated and Controlled,  Endo/Other  negative endocrine ROS  Renal/GU negative Renal ROS  negative genitourinary   Musculoskeletal Closed fracture of left distal radius   Abdominal   Peds  Hematology negative hematology ROS (+)   Anesthesia Other Findings Day of surgery medications reviewed with patient.  Reproductive/Obstetrics negative OB ROS                            Anesthesia Physical Anesthesia Plan  ASA: II  Anesthesia Plan: MAC and Regional   Post-op Pain Management:    Induction:   PONV Risk Score and Plan: 2 and Propofol infusion, Treatment may vary due to age or medical condition and Ondansetron  Airway Management Planned: Natural Airway and Simple Face Mask  Additional Equipment: None  Intra-op Plan:   Post-operative Plan:   Informed Consent: I have reviewed the patients History and Physical, chart, labs and discussed the procedure including the risks, benefits and alternatives for the proposed anesthesia with the patient or authorized representative who has indicated his/her understanding and acceptance.       Plan Discussed with: CRNA  Anesthesia Plan Comments:        Anesthesia Quick Evaluation

## 2020-03-23 ENCOUNTER — Ambulatory Visit (HOSPITAL_BASED_OUTPATIENT_CLINIC_OR_DEPARTMENT_OTHER): Payer: Medicare HMO | Admitting: Certified Registered Nurse Anesthetist

## 2020-03-23 ENCOUNTER — Other Ambulatory Visit: Payer: Self-pay

## 2020-03-23 ENCOUNTER — Encounter (HOSPITAL_BASED_OUTPATIENT_CLINIC_OR_DEPARTMENT_OTHER): Payer: Self-pay | Admitting: Orthopedic Surgery

## 2020-03-23 ENCOUNTER — Encounter (HOSPITAL_BASED_OUTPATIENT_CLINIC_OR_DEPARTMENT_OTHER)
Admission: RE | Disposition: A | Discharge status: Home / Self Care | Payer: Self-pay | Source: Home / Self Care | Attending: Orthopedic Surgery

## 2020-03-23 ENCOUNTER — Ambulatory Visit (HOSPITAL_BASED_OUTPATIENT_CLINIC_OR_DEPARTMENT_OTHER)
Admission: RE | Admit: 2020-03-23 | Discharge: 2020-03-23 | Disposition: A | Discharge status: Home / Self Care | Payer: Medicare HMO | Attending: Orthopedic Surgery | Admitting: Orthopedic Surgery

## 2020-03-23 DIAGNOSIS — Z79899 Other long term (current) drug therapy: Secondary | ICD-10-CM | POA: Insufficient documentation

## 2020-03-23 DIAGNOSIS — R69 Illness, unspecified: Secondary | ICD-10-CM | POA: Diagnosis not present

## 2020-03-23 DIAGNOSIS — E785 Hyperlipidemia, unspecified: Secondary | ICD-10-CM | POA: Diagnosis not present

## 2020-03-23 DIAGNOSIS — Z859 Personal history of malignant neoplasm, unspecified: Secondary | ICD-10-CM | POA: Insufficient documentation

## 2020-03-23 DIAGNOSIS — S52502A Unspecified fracture of the lower end of left radius, initial encounter for closed fracture: Secondary | ICD-10-CM | POA: Insufficient documentation

## 2020-03-23 DIAGNOSIS — Z809 Family history of malignant neoplasm, unspecified: Secondary | ICD-10-CM | POA: Diagnosis not present

## 2020-03-23 DIAGNOSIS — Z8349 Family history of other endocrine, nutritional and metabolic diseases: Secondary | ICD-10-CM | POA: Insufficient documentation

## 2020-03-23 DIAGNOSIS — Z8249 Family history of ischemic heart disease and other diseases of the circulatory system: Secondary | ICD-10-CM | POA: Insufficient documentation

## 2020-03-23 DIAGNOSIS — Y939 Activity, unspecified: Secondary | ICD-10-CM | POA: Diagnosis not present

## 2020-03-23 DIAGNOSIS — S52572A Other intraarticular fracture of lower end of left radius, initial encounter for closed fracture: Secondary | ICD-10-CM | POA: Diagnosis not present

## 2020-03-23 DIAGNOSIS — W19XXXA Unspecified fall, initial encounter: Secondary | ICD-10-CM | POA: Diagnosis not present

## 2020-03-23 DIAGNOSIS — Z885 Allergy status to narcotic agent status: Secondary | ICD-10-CM | POA: Diagnosis not present

## 2020-03-23 DIAGNOSIS — S62306A Unspecified fracture of fifth metacarpal bone, right hand, initial encounter for closed fracture: Secondary | ICD-10-CM | POA: Diagnosis not present

## 2020-03-23 HISTORY — DX: Unspecified fracture of the lower end of left radius, initial encounter for closed fracture: S52.502A

## 2020-03-23 HISTORY — PX: OPEN REDUCTION INTERNAL FIXATION (ORIF) DISTAL RADIAL FRACTURE: SHX5989

## 2020-03-23 HISTORY — DX: Other chronic pain: G89.29

## 2020-03-23 HISTORY — DX: Anxiety disorder, unspecified: F41.9

## 2020-03-23 SURGERY — OPEN REDUCTION INTERNAL FIXATION (ORIF) DISTAL RADIUS FRACTURE
Anesthesia: Monitor Anesthesia Care | Site: Wrist | Laterality: Left

## 2020-03-23 MED ORDER — MIDAZOLAM HCL 2 MG/2ML IJ SOLN
INTRAMUSCULAR | Status: AC
  Filled 2020-03-23: qty 2

## 2020-03-23 MED ORDER — PROPOFOL 500 MG/50ML IV EMUL
INTRAVENOUS | Status: DC | PRN
  Administered 2020-03-23: 100 ug/kg/min via INTRAVENOUS

## 2020-03-23 MED ORDER — CEFAZOLIN SODIUM-DEXTROSE 2-4 GM/100ML-% IV SOLN
INTRAVENOUS | Status: AC
  Filled 2020-03-23: qty 100

## 2020-03-23 MED ORDER — FENTANYL CITRATE (PF) 100 MCG/2ML IJ SOLN
INTRAMUSCULAR | Status: AC
  Filled 2020-03-23: qty 2

## 2020-03-23 MED ORDER — ONDANSETRON HCL 4 MG/2ML IJ SOLN
INTRAMUSCULAR | Status: DC | PRN
  Administered 2020-03-23: 4 mg via INTRAVENOUS

## 2020-03-23 MED ORDER — CLONIDINE HCL (ANALGESIA) 100 MCG/ML EP SOLN
EPIDURAL | Status: DC | PRN
  Administered 2020-03-23: 100 ug

## 2020-03-23 MED ORDER — ACETAMINOPHEN 500 MG PO TABS
ORAL_TABLET | ORAL | Status: AC
  Filled 2020-03-23: qty 2

## 2020-03-23 MED ORDER — EPHEDRINE SULFATE 50 MG/ML IJ SOLN
INTRAMUSCULAR | Status: DC | PRN
  Administered 2020-03-23: 10 mg via INTRAVENOUS

## 2020-03-23 MED ORDER — PROMETHAZINE HCL 25 MG/ML IJ SOLN
6.2500 mg | INTRAMUSCULAR | Status: DC | PRN
Start: 2020-03-23 — End: 2020-03-23

## 2020-03-23 MED ORDER — BUPIVACAINE-EPINEPHRINE (PF) 0.5% -1:200000 IJ SOLN
INTRAMUSCULAR | Status: DC | PRN
  Administered 2020-03-23: 30 mL via PERINEURAL

## 2020-03-23 MED ORDER — FENTANYL CITRATE (PF) 100 MCG/2ML IJ SOLN
100.0000 ug | Freq: Once | INTRAMUSCULAR | Status: AC
  Administered 2020-03-23: 50 ug via INTRAVENOUS

## 2020-03-23 MED ORDER — CEFAZOLIN SODIUM-DEXTROSE 2-4 GM/100ML-% IV SOLN: 2.0000 g | INTRAVENOUS | Status: DC

## 2020-03-23 MED ORDER — FENTANYL CITRATE (PF) 100 MCG/2ML IJ SOLN: 25.0000 ug | INTRAMUSCULAR | Status: DC | PRN

## 2020-03-23 MED ORDER — CEFAZOLIN SODIUM-DEXTROSE 2-3 GM-%(50ML) IV SOLR
INTRAVENOUS | Status: DC | PRN
  Administered 2020-03-23: 2 g via INTRAVENOUS

## 2020-03-23 MED ORDER — LACTATED RINGERS IV SOLN: INTRAVENOUS | Status: DC

## 2020-03-23 MED ORDER — HYDROCODONE-IBUPROFEN 5-200 MG PO TABS: 1.0000 | ORAL_TABLET | Freq: Three times a day (TID) | ORAL | 0 refills | Status: AC | PRN

## 2020-03-23 MED ORDER — PROPOFOL 10 MG/ML IV BOLUS
INTRAVENOUS | Status: DC | PRN
  Administered 2020-03-23 (×2): 20 mg via INTRAVENOUS

## 2020-03-23 MED ORDER — ACETAMINOPHEN 500 MG PO TABS
1000.0000 mg | ORAL_TABLET | Freq: Once | ORAL | Status: AC
  Administered 2020-03-23: 1000 mg via ORAL

## 2020-03-23 SURGICAL SUPPLY — 81 items
APL SKNCLS STERI-STRIP NONHPOA (GAUZE/BANDAGES/DRESSINGS) ×1
BENZOIN TINCTURE PRP APPL 2/3 (GAUZE/BANDAGES/DRESSINGS) ×1 IMPLANT
BIT DRILL 2 FAST STEP (BIT) ×1 IMPLANT
BIT DRILL 2.5X4 QC (BIT) ×1 IMPLANT
BLADE MINI RND TIP GREEN BEAV (BLADE) IMPLANT
BLADE SURG 15 STRL LF DISP TIS (BLADE) ×2 IMPLANT
BLADE SURG 15 STRL SS (BLADE) ×4
BNDG CMPR 9X4 STRL LF SNTH (GAUZE/BANDAGES/DRESSINGS) ×1
BNDG ELASTIC 3X5.8 VLCR STR LF (GAUZE/BANDAGES/DRESSINGS) IMPLANT
BNDG ELASTIC 4X5.8 VLCR STR LF (GAUZE/BANDAGES/DRESSINGS) ×2 IMPLANT
BNDG ESMARK 4X9 LF (GAUZE/BANDAGES/DRESSINGS) ×1 IMPLANT
BNDG GAUZE ELAST 4 BULKY (GAUZE/BANDAGES/DRESSINGS) ×2 IMPLANT
CANISTER SUCT 1200ML W/VALVE (MISCELLANEOUS) IMPLANT
CORD BIPOLAR FORCEPS 12FT (ELECTRODE) ×2 IMPLANT
COVER BACK TABLE 60X90IN (DRAPES) ×2 IMPLANT
COVER WAND RF STERILE (DRAPES) IMPLANT
CUFF TOURN SGL QUICK 18X4 (TOURNIQUET CUFF) ×1 IMPLANT
DECANTER SPIKE VIAL GLASS SM (MISCELLANEOUS) IMPLANT
DRAPE EXTREMITY T 121X128X90 (DISPOSABLE) ×2 IMPLANT
DRAPE OEC MINIVIEW 54X84 (DRAPES) ×2 IMPLANT
DRAPE SURG 17X23 STRL (DRAPES) ×2 IMPLANT
DRIVER PEG 2.0 FAST (BIT) ×1 IMPLANT
DURAPREP 26ML APPLICATOR (WOUND CARE) ×2 IMPLANT
ELECT REM PT RETURN 9FT ADLT (ELECTROSURGICAL)
ELECTRODE REM PT RTRN 9FT ADLT (ELECTROSURGICAL) IMPLANT
GAUZE 4X4 16PLY RFD (DISPOSABLE) IMPLANT
GAUZE SPONGE 4X4 12PLY STRL (GAUZE/BANDAGES/DRESSINGS) ×2 IMPLANT
GAUZE XEROFORM 1X8 LF (GAUZE/BANDAGES/DRESSINGS) IMPLANT
GLOVE ECLIPSE 6.5 STRL STRAW (GLOVE) ×1 IMPLANT
GLOVE SURG LX 7.5 STRW (GLOVE) ×1
GLOVE SURG LX STRL 7.5 STRW (GLOVE) IMPLANT
GLOVE SURG SYN 8.0 (GLOVE) ×4 IMPLANT
GLOVE SURG SYN 8.0 PF PI (GLOVE) ×1 IMPLANT
GLOVE SURG UNDER POLY LF SZ7 (GLOVE) ×1 IMPLANT
GOWN STRL REIN XL XLG (GOWN DISPOSABLE) ×3 IMPLANT
GOWN STRL REUS W/ TWL LRG LVL3 (GOWN DISPOSABLE) ×1 IMPLANT
GOWN STRL REUS W/TWL LRG LVL3 (GOWN DISPOSABLE) ×4
NDL HYPO 25X1 1.5 SAFETY (NEEDLE) ×1 IMPLANT
NEEDLE HYPO 25X1 1.5 SAFETY (NEEDLE) IMPLANT
NS IRRIG 1000ML POUR BTL (IV SOLUTION) ×2 IMPLANT
PACK BASIN DAY SURGERY FS (CUSTOM PROCEDURE TRAY) ×2 IMPLANT
PAD CAST 3X4 CTTN HI CHSV (CAST SUPPLIES) ×1 IMPLANT
PAD CAST 4YDX4 CTTN HI CHSV (CAST SUPPLIES) IMPLANT
PADDING CAST COTTON 3X4 STRL (CAST SUPPLIES) ×2
PADDING CAST COTTON 4X4 STRL (CAST SUPPLIES)
PEG SUBCHONDRAL SMOOTH 2.0X18 (Peg) ×1 IMPLANT
PEG SUBCHONDRAL SMOOTH 2.0X20 (Peg) ×5 IMPLANT
PEG SUBCHONDRAL SMOOTH 2.0X22 (Peg) ×2 IMPLANT
PENCIL SMOKE EVACUATOR (MISCELLANEOUS) IMPLANT
PLATE STAN 24.4X59.5 LT (Plate) ×1 IMPLANT
SCREW BN 12X3.5XNS CORT TI (Screw) IMPLANT
SCREW CORT 3.5X10 LNG (Screw) ×1 IMPLANT
SCREW CORT 3.5X12 (Screw) ×4 IMPLANT
SCREW CORT 3.5X14 LNG (Screw) ×1 IMPLANT
SHEET MEDIUM DRAPE 40X70 STRL (DRAPES) ×2 IMPLANT
SLEEVE SCD COMPRESS KNEE MED (MISCELLANEOUS) ×2 IMPLANT
SLING ARM FOAM STRAP MED (SOFTGOODS) ×1 IMPLANT
SPLINT PLASTER CAST XFAST 3X15 (CAST SUPPLIES) IMPLANT
SPLINT PLASTER CAST XFAST 4X15 (CAST SUPPLIES) ×15 IMPLANT
SPLINT PLASTER XTRA FAST SET 4 (CAST SUPPLIES)
SPLINT PLASTER XTRA FASTSET 3X (CAST SUPPLIES) ×10
STOCKINETTE 4X48 STRL (DRAPES) ×2 IMPLANT
STRIP CLOSURE SKIN 1/2X4 (GAUZE/BANDAGES/DRESSINGS) ×1 IMPLANT
SUCTION FRAZIER HANDLE 10FR (MISCELLANEOUS)
SUCTION TUBE FRAZIER 10FR DISP (MISCELLANEOUS) IMPLANT
SUT CHROMIC 3 0 PS 2 (SUTURE) IMPLANT
SUT ETHILON 4 0 PS 2 18 (SUTURE) IMPLANT
SUT MERSILENE 4 0 P 3 (SUTURE) IMPLANT
SUT PROLENE 3 0 PS 2 (SUTURE) ×2 IMPLANT
SUT VIC AB 0 SH 27 (SUTURE) IMPLANT
SUT VIC AB 2-0 SH 27 (SUTURE)
SUT VIC AB 2-0 SH 27XBRD (SUTURE) IMPLANT
SUT VIC AB 3-0 FS2 27 (SUTURE) ×1 IMPLANT
SUT VIC AB 4-0 RB1 18 (SUTURE) ×1 IMPLANT
SUT VICRYL RAPIDE 4-0 (SUTURE) IMPLANT
SUT VICRYL RAPIDE 4/0 PS 2 (SUTURE) IMPLANT
SYR 10ML LL (SYRINGE) ×1 IMPLANT
SYR BULB EAR ULCER 3OZ GRN STR (SYRINGE) ×2 IMPLANT
TOWEL GREEN STERILE FF (TOWEL DISPOSABLE) ×3 IMPLANT
TUBE CONNECTING 20X1/4 (TUBING) IMPLANT
UNDERPAD 30X36 HEAVY ABSORB (UNDERPADS AND DIAPERS) ×2 IMPLANT

## 2020-03-23 NOTE — Brief Op Note (Signed)
03/23/2020  10:45 AM  PATIENT:  Homero Fellers  75 y.o. female  PRE-OPERATIVE DIAGNOSIS:  LEFT DISTAL RADIUS FRACTURE  POST-OPERATIVE DIAGNOSIS:  LEFT DISTAL RADIUS FRACTURE  PROCEDURE:  Procedure(s) with comments: OPEN REDUCTION INTERNAL FIXATION (ORIF) DISTAL RADIAL FRACTURE (Left) - AXILLARY BLOCK  SURGEON:  Surgeon(s) and Role:    Charlotte Crumb, MD - Primary  PHYSICIAN ASSISTANT: Leverne Humbles PA-C  ASSISTANTS: As above  ANESTHESIA:   regional  EBL: Minimal  BLOOD ADMINISTERED:none  DRAINS: none   LOCAL MEDICATIONS USED:  NONE  SPECIMEN:  No Specimen  DISPOSITION OF SPECIMEN:  N/A  COUNTS:  YES  TOURNIQUET:  * Missing tourniquet times found for documented tourniquets in log: [id number] *  DICTATION: .Dragon Dictation  PLAN OF CARE: Discharge to home after PACU  PATIENT DISPOSITION:  PACU - hemodynamically stable.   Delay start of Pharmacological VTE agent (>24hrs) due to surgical blood loss or risk of bleeding: yes

## 2020-03-23 NOTE — H&P (Signed)
[facility] is an 75 y.o. female.   Chief Complaint: Left distal forearm and wrist pain HPI: Patient is a very pleasant 75 year old female status post fall onto an outstretched upper extremity left with distal radius fracture probably displaced left side.  Past Medical History:  Diagnosis Date  . Anxiety   . Cancer ([place])   . Chronic pain   . Closed fracture of left distal radius   . Depression   . GERD (gastroesophageal reflux disease)   . Hyperlipidemia     Past Surgical History:  Procedure Laterality Date  . EYE SURGERY     cataract  . HAND SURGERY Right   . JOINT REPLACEMENT Right 2020   TKR  . LAPAROSCOPY      Family History  Problem Relation Age of Onset  . Heart disease Father   . Hyperlipidemia Father   . Hyperlipidemia Sister   . Cancer Sister   . Hyperlipidemia Brother   . Cancer Brother    Social History:  reports that she has never smoked. She has never used smokeless tobacco. She reports current alcohol use. She reports that she does not use drugs.  Allergies:  Allergies  Allergen Reactions  . Oxycontin [Oxycodone]     'made me go crazy'    Medications Prior to Admission  Medication Sig Dispense Refill  . alendronate (FOSAMAX) 35 MG tablet Take 35 mg by mouth every 7 (seven) days. Take with a full glass of water on an empty stomach.    . cyclobenzaprine (FLEXERIL) 10 MG tablet Take 10 mg by mouth 3 (three) times daily as needed for muscle spasms.    Marland Kitchen HYDROcodone-acetaminophen (NORCO) 10-325 MG tablet Take 1 tablet by mouth every 8 (eight) hours as needed. 15 tablet 0  . HYDROcodone-acetaminophen (NORCO/VICODIN) 5-325 MG tablet Take 1 tablet by mouth every 6 (six) hours as needed for moderate pain.    Marland Kitchen omeprazole (PRILOSEC) 10 MG capsule Take 10 mg by mouth daily.    . sertraline (ZOLOFT) 100 MG tablet Take 100 mg by mouth daily.    . simvastatin (ZOCOR) 10 MG tablet Take 10 mg by mouth daily.      No results found for this or any previous visit  (from the past 48 hour(s)). No results found.  Review of Systems  All other systems reviewed and are negative.   Blood pressure 127/77, pulse 76, temperature (!) 97.5 F (36.4 C), temperature source Oral, resp. rate 16, height 5\' 3"  (1.6 m), weight 56.2 kg, SpO2 100 %. Physical Exam Constitutional:      Appearance: Normal appearance.  HENT:     Head: Normocephalic and atraumatic.  Cardiovascular:     Rate and Rhythm: Normal rate.  Pulmonary:     Effort: Pulmonary effort is normal.  Musculoskeletal:     Left wrist: Swelling, deformity and bony tenderness present.     Cervical back: Normal range of motion.     Comments: Left distal forearm and wrist pain, swelling, and deformity status post fall  Neurological:     General: No focal deficit present.     Mental Status: She is alert and oriented to person, place, and time.  Psychiatric:        Mood and Affect: Mood normal.        Behavior: Behavior normal.        Thought Content: Thought content normal.        Judgment: Judgment normal.      Assessment/Plan 74 year old female with  probably displaced left distal radius fracture with pain and deformity.  Have discussed role of operative fixation of this fracture with volar plating and screws.  Patient understands risk benefits and the fact we cannot guarantee complete pain relief and further surgery may be required in the future.  She understands risk benefits and wished to proceed today as an outpatient.  Schuyler Amor, MD 03/23/2020, 9:53 AM

## 2020-03-23 NOTE — Discharge Instructions (Signed)
1,000mg  Tylenol at 8:20am   Post Anesthesia Home Care Instructions  Activity: Get plenty of rest for the remainder of the day. A responsible individual must stay with you for 24 hours following the procedure.  For the next 24 hours, DO NOT: -Drive a car -Paediatric nurse -Drink alcoholic beverages -Take any medication unless instructed by your physician -Make any legal decisions or sign important papers.  Meals: Start with liquid foods such as gelatin or soup. Progress to regular foods as tolerated. Avoid greasy, spicy, heavy foods. If nausea and/or vomiting occur, drink only clear liquids until the nausea and/or vomiting subsides. Call your physician if vomiting continues.  Special Instructions/Symptoms: Your throat may feel dry or sore from the anesthesia or the breathing tube placed in your throat during surgery. If this causes discomfort, gargle with warm salt water. The discomfort should disappear within 24 hours.  If you had a scopolamine patch placed behind your ear for the management of post- operative nausea and/or vomiting:  1. The medication in the patch is effective for 72 hours, after which it should be removed.  Wrap patch in a tissue and discard in the trash. Wash hands thoroughly with soap and water. 2. You may remove the patch earlier than 72 hours if you experience unpleasant side effects which may include dry mouth, dizziness or visual disturbances. 3. Avoid touching the patch. Wash your hands with soap and water after contact with the patch.    Regional Anesthesia Blocks  1. Numbness or the inability to move the "blocked" extremity may last from 3-48 hours after placement. The length of time depends on the medication injected and your individual response to the medication. If the numbness is not going away after 48 hours, call your surgeon.  2. The extremity that is blocked will need to be protected until the numbness is gone and the  Strength has returned. Because  you cannot feel it, you will need to take extra care to avoid injury. Because it may be weak, you may have difficulty moving it or using it. You may not know what position it is in without looking at it while the block is in effect.  3. For blocks in the legs and feet, returning to weight bearing and walking needs to be done carefully. You will need to wait until the numbness is entirely gone and the strength has returned. You should be able to move your leg and foot normally before you try and bear weight or walk. You will need someone to be with you when you first try to ensure you do not fall and possibly risk injury.  4. Bruising and tenderness at the needle site are common side effects and will resolve in a few days.  5. Persistent numbness or new problems with movement should be communicated to the surgeon or the [place] [phone] [place] [phone]).

## 2020-03-23 NOTE — Progress Notes (Signed)
Assisted Dr. Daiva Huge with left, ultrasound guided, axillary block. Side rails up, monitors on throughout procedure. See vital signs in flow sheet. Tolerated Procedure well.

## 2020-03-23 NOTE — Op Note (Signed)
Patient was taken to the operating suite and after induction of adequate regional anesthetic and IV sedation the left upper extremity was prepped and draped in the usual sterile fashion. An Esmarch was used to exsanguinate the limb and the tourniquet was inflated 250 mmHg. At this point time incision made on the volar forearm and wrist distally over the FCR tendon and the skin was incised 6 cm. The sheath overlying the FCR was incised and the FCR tendon was retracted to the midline of the radial artery to the lateral side. We dissected down to the level of the pronator quadratus which was carefully subperiosteally stripped off the volar distal radius revealing a fully displaced intra-articular fracture of the distal radius three-part in nature. We carefully released the brachioradialis off the radial styloid fragment to ease and reduction. Reduction was performed with longitudinal traction and extension over a rolled up towel. Once we maintain reduction we are able to place a standard DVR plate palmarly with three cortical screws proximally followed by seven smooth pegs distally. This was done under direct and fluoroscopic imaging and confirmed with fluoroscopic images. We then thoroughly irrigated loosely closed in layers of 2-0 undyed Vicryl to cover the plate, 4-0 Vicryl subcutaneously, and a 3-0 Prolene subcuticular stitch on the skin. Steri-Strips, 4 x 4's, and a palmar splint was applied. The patient tolerated this procedure well and went to her room stable fashion.

## 2020-03-23 NOTE — Transfer of Care (Signed)
Immediate Anesthesia Transfer of Care Note  Patient: [facility]  Procedure(s) Performed: OPEN REDUCTION INTERNAL FIXATION (ORIF) DISTAL RADIAL FRACTURE (Left Wrist)  Patient Location: PACU  Anesthesia Type:MAC combined with regional for post-op pain  Level of Consciousness: awake, alert  and oriented  Airway & Oxygen Therapy: Patient Spontanous Breathing  Post-op Assessment: Report given to RN and Post -op Vital signs reviewed and stable  Post vital signs: Reviewed and stable  Last Vitals:  Vitals Value Taken Time  BP    Temp    Pulse    Resp    SpO2      Last Pain:  Vitals:   03/23/20 0812  TempSrc: Oral  PainSc: 0-No pain         Complications: No complications documented.

## 2020-03-23 NOTE — Anesthesia Postprocedure Evaluation (Signed)
Anesthesia Post Note  Patient: [facility]  Procedure(s) Performed: OPEN REDUCTION INTERNAL FIXATION (ORIF) DISTAL RADIAL FRACTURE (Left Wrist)     Patient location during evaluation: PACU Anesthesia Type: Regional Level of consciousness: awake and alert and oriented Pain management: pain level controlled Vital Signs Assessment: post-procedure vital signs reviewed and stable Respiratory status: spontaneous breathing, nonlabored ventilation and respiratory function stable Cardiovascular status: blood pressure returned to baseline Postop Assessment: no apparent nausea or vomiting Anesthetic complications: no   No complications documented.  Last Vitals:  Vitals:   03/23/20 1111 03/23/20 1137  BP: 97/60 92/73  Pulse: 76 76  Resp: 16 16  Temp: (!) 36.2 C (!) 36.2 C  SpO2: 97% 97%    Last Pain:  Vitals:   03/23/20 1137  TempSrc:   PainSc: 0-No pain                 Brennan Bailey

## 2020-03-23 NOTE — Anesthesia Procedure Notes (Signed)
Anesthesia Regional Block: Supraclavicular block   Pre-Anesthetic Checklist: ,, timeout performed, Correct Patient, Correct Site, Correct Laterality, Correct Procedure, Correct Position, site marked, Risks and benefits discussed, pre-op evaluation,  At surgeon's request and post-op pain management  Laterality: Left  Prep: Maximum Sterile Barrier Precautions used, chloraprep       Needles:  Injection technique: Single-shot  Needle Type: Echogenic Stimulator Needle     Needle Length: 9cm  Needle Gauge: 22     Additional Needles:   Procedures:,,,, ultrasound used (permanent image in chart),,,,  Narrative:  Start time: 03/23/2020 9:34 AM End time: 03/23/2020 9:37 AM Injection made incrementally with aspirations every 5 mL.  Performed by: Personally  Anesthesiologist: Brennan Bailey, MD  Additional Notes: Risks, benefits, and alternative discussed. Patient gave consent for procedure. Patient prepped and draped in sterile fashion. Sedation administered, patient remains easily responsive to voice. Relevant anatomy identified with ultrasound guidance. Local anesthetic given in 5cc increments with no signs or symptoms of intravascular injection. No pain or paraesthesias with injection. Patient monitored throughout procedure with signs of LAST or immediate complications. Tolerated well. Ultrasound image placed in chart.  Tawny Asal, MD

## 2020-03-26 ENCOUNTER — Encounter (HOSPITAL_BASED_OUTPATIENT_CLINIC_OR_DEPARTMENT_OTHER): Payer: Self-pay | Admitting: Orthopedic Surgery

## 2020-03-27 ENCOUNTER — Ambulatory Visit: Payer: Medicare HMO | Admitting: Sports Medicine

## 2020-03-27 DIAGNOSIS — M25532 Pain in left wrist: Secondary | ICD-10-CM | POA: Diagnosis not present

## 2020-03-27 DIAGNOSIS — M9901 Segmental and somatic dysfunction of cervical region: Secondary | ICD-10-CM | POA: Diagnosis not present

## 2020-03-27 DIAGNOSIS — M9903 Segmental and somatic dysfunction of lumbar region: Secondary | ICD-10-CM | POA: Diagnosis not present

## 2020-03-27 DIAGNOSIS — M9902 Segmental and somatic dysfunction of thoracic region: Secondary | ICD-10-CM | POA: Diagnosis not present

## 2020-03-27 DIAGNOSIS — M9904 Segmental and somatic dysfunction of sacral region: Secondary | ICD-10-CM | POA: Diagnosis not present

## 2020-03-27 DIAGNOSIS — S52572A Other intraarticular fracture of lower end of left radius, initial encounter for closed fracture: Secondary | ICD-10-CM | POA: Diagnosis not present

## 2020-03-28 DIAGNOSIS — M9902 Segmental and somatic dysfunction of thoracic region: Secondary | ICD-10-CM | POA: Diagnosis not present

## 2020-03-28 DIAGNOSIS — M9903 Segmental and somatic dysfunction of lumbar region: Secondary | ICD-10-CM | POA: Diagnosis not present

## 2020-03-28 DIAGNOSIS — M9904 Segmental and somatic dysfunction of sacral region: Secondary | ICD-10-CM | POA: Diagnosis not present

## 2020-03-28 DIAGNOSIS — M9901 Segmental and somatic dysfunction of cervical region: Secondary | ICD-10-CM | POA: Diagnosis not present

## 2020-04-02 DIAGNOSIS — M9902 Segmental and somatic dysfunction of thoracic region: Secondary | ICD-10-CM | POA: Diagnosis not present

## 2020-04-02 DIAGNOSIS — M9904 Segmental and somatic dysfunction of sacral region: Secondary | ICD-10-CM | POA: Diagnosis not present

## 2020-04-02 DIAGNOSIS — M9901 Segmental and somatic dysfunction of cervical region: Secondary | ICD-10-CM | POA: Diagnosis not present

## 2020-04-02 DIAGNOSIS — M9903 Segmental and somatic dysfunction of lumbar region: Secondary | ICD-10-CM | POA: Diagnosis not present

## 2020-04-03 DIAGNOSIS — M9901 Segmental and somatic dysfunction of cervical region: Secondary | ICD-10-CM | POA: Diagnosis not present

## 2020-04-03 DIAGNOSIS — M9904 Segmental and somatic dysfunction of sacral region: Secondary | ICD-10-CM | POA: Diagnosis not present

## 2020-04-03 DIAGNOSIS — M9903 Segmental and somatic dysfunction of lumbar region: Secondary | ICD-10-CM | POA: Diagnosis not present

## 2020-04-03 DIAGNOSIS — M9902 Segmental and somatic dysfunction of thoracic region: Secondary | ICD-10-CM | POA: Diagnosis not present

## 2020-04-05 DIAGNOSIS — M9901 Segmental and somatic dysfunction of cervical region: Secondary | ICD-10-CM | POA: Diagnosis not present

## 2020-04-05 DIAGNOSIS — M9902 Segmental and somatic dysfunction of thoracic region: Secondary | ICD-10-CM | POA: Diagnosis not present

## 2020-04-05 DIAGNOSIS — M9903 Segmental and somatic dysfunction of lumbar region: Secondary | ICD-10-CM | POA: Diagnosis not present

## 2020-04-05 DIAGNOSIS — M9904 Segmental and somatic dysfunction of sacral region: Secondary | ICD-10-CM | POA: Diagnosis not present

## 2020-04-10 DIAGNOSIS — Z1283 Encounter for screening for malignant neoplasm of skin: Secondary | ICD-10-CM | POA: Diagnosis not present

## 2020-04-10 DIAGNOSIS — Z85820 Personal history of malignant melanoma of skin: Secondary | ICD-10-CM | POA: Diagnosis not present

## 2020-04-10 DIAGNOSIS — C44319 Basal cell carcinoma of skin of other parts of face: Secondary | ICD-10-CM | POA: Diagnosis not present

## 2020-04-10 DIAGNOSIS — D225 Melanocytic nevi of trunk: Secondary | ICD-10-CM | POA: Diagnosis not present

## 2020-04-10 DIAGNOSIS — Z08 Encounter for follow-up examination after completed treatment for malignant neoplasm: Secondary | ICD-10-CM | POA: Diagnosis not present

## 2020-04-11 DIAGNOSIS — M9902 Segmental and somatic dysfunction of thoracic region: Secondary | ICD-10-CM | POA: Diagnosis not present

## 2020-04-11 DIAGNOSIS — M9903 Segmental and somatic dysfunction of lumbar region: Secondary | ICD-10-CM | POA: Diagnosis not present

## 2020-04-11 DIAGNOSIS — M9904 Segmental and somatic dysfunction of sacral region: Secondary | ICD-10-CM | POA: Diagnosis not present

## 2020-04-11 DIAGNOSIS — M9901 Segmental and somatic dysfunction of cervical region: Secondary | ICD-10-CM | POA: Diagnosis not present

## 2020-04-17 DIAGNOSIS — S52592D Other fractures of lower end of left radius, subsequent encounter for closed fracture with routine healing: Secondary | ICD-10-CM | POA: Diagnosis not present

## 2020-04-18 DIAGNOSIS — M9901 Segmental and somatic dysfunction of cervical region: Secondary | ICD-10-CM | POA: Diagnosis not present

## 2020-04-18 DIAGNOSIS — M9902 Segmental and somatic dysfunction of thoracic region: Secondary | ICD-10-CM | POA: Diagnosis not present

## 2020-04-18 DIAGNOSIS — M9904 Segmental and somatic dysfunction of sacral region: Secondary | ICD-10-CM | POA: Diagnosis not present

## 2020-04-18 DIAGNOSIS — M9903 Segmental and somatic dysfunction of lumbar region: Secondary | ICD-10-CM | POA: Diagnosis not present

## 2020-04-19 DIAGNOSIS — M9903 Segmental and somatic dysfunction of lumbar region: Secondary | ICD-10-CM | POA: Diagnosis not present

## 2020-04-19 DIAGNOSIS — M9902 Segmental and somatic dysfunction of thoracic region: Secondary | ICD-10-CM | POA: Diagnosis not present

## 2020-04-19 DIAGNOSIS — M9901 Segmental and somatic dysfunction of cervical region: Secondary | ICD-10-CM | POA: Diagnosis not present

## 2020-04-19 DIAGNOSIS — M9904 Segmental and somatic dysfunction of sacral region: Secondary | ICD-10-CM | POA: Diagnosis not present

## 2020-04-23 DIAGNOSIS — M9902 Segmental and somatic dysfunction of thoracic region: Secondary | ICD-10-CM | POA: Diagnosis not present

## 2020-04-23 DIAGNOSIS — M9903 Segmental and somatic dysfunction of lumbar region: Secondary | ICD-10-CM | POA: Diagnosis not present

## 2020-04-23 DIAGNOSIS — M9901 Segmental and somatic dysfunction of cervical region: Secondary | ICD-10-CM | POA: Diagnosis not present

## 2020-04-23 DIAGNOSIS — M9904 Segmental and somatic dysfunction of sacral region: Secondary | ICD-10-CM | POA: Diagnosis not present

## 2020-04-25 DIAGNOSIS — M9901 Segmental and somatic dysfunction of cervical region: Secondary | ICD-10-CM | POA: Diagnosis not present

## 2020-04-25 DIAGNOSIS — M9903 Segmental and somatic dysfunction of lumbar region: Secondary | ICD-10-CM | POA: Diagnosis not present

## 2020-04-25 DIAGNOSIS — M9902 Segmental and somatic dysfunction of thoracic region: Secondary | ICD-10-CM | POA: Diagnosis not present

## 2020-04-25 DIAGNOSIS — M9904 Segmental and somatic dysfunction of sacral region: Secondary | ICD-10-CM | POA: Diagnosis not present

## 2020-05-07 ENCOUNTER — Ambulatory Visit: Payer: Medicare HMO | Admitting: Sports Medicine

## 2020-05-07 DIAGNOSIS — S81811A Laceration without foreign body, right lower leg, initial encounter: Secondary | ICD-10-CM | POA: Diagnosis not present

## 2020-05-07 DIAGNOSIS — W6191XA Bitten by other birds, initial encounter: Secondary | ICD-10-CM | POA: Diagnosis not present

## 2020-05-08 DIAGNOSIS — S52572D Other intraarticular fracture of lower end of left radius, subsequent encounter for closed fracture with routine healing: Secondary | ICD-10-CM | POA: Diagnosis not present

## 2020-05-11 ENCOUNTER — Ambulatory Visit: Payer: Medicare HMO | Admitting: Sports Medicine

## 2020-05-14 ENCOUNTER — Other Ambulatory Visit: Payer: Self-pay

## 2020-05-14 ENCOUNTER — Ambulatory Visit (INDEPENDENT_AMBULATORY_CARE_PROVIDER_SITE_OTHER): Payer: Medicare HMO

## 2020-05-14 ENCOUNTER — Ambulatory Visit: Payer: Medicare HMO | Admitting: Sports Medicine

## 2020-05-14 ENCOUNTER — Ambulatory Visit (INDEPENDENT_AMBULATORY_CARE_PROVIDER_SITE_OTHER): Payer: Medicare HMO | Admitting: Sports Medicine

## 2020-05-14 DIAGNOSIS — M7551 Bursitis of right shoulder: Secondary | ICD-10-CM

## 2020-05-14 DIAGNOSIS — M25512 Pain in left shoulder: Secondary | ICD-10-CM | POA: Diagnosis not present

## 2020-05-14 DIAGNOSIS — G8929 Other chronic pain: Secondary | ICD-10-CM | POA: Diagnosis not present

## 2020-05-14 DIAGNOSIS — M12811 Other specific arthropathies, not elsewhere classified, right shoulder: Secondary | ICD-10-CM | POA: Insufficient documentation

## 2020-05-14 DIAGNOSIS — M75101 Unspecified rotator cuff tear or rupture of right shoulder, not specified as traumatic: Secondary | ICD-10-CM | POA: Insufficient documentation

## 2020-05-14 DIAGNOSIS — M19011 Primary osteoarthritis, right shoulder: Secondary | ICD-10-CM | POA: Diagnosis not present

## 2020-05-14 NOTE — Assessment & Plan Note (Signed)
Acute pain right shoulder, posterior, worse with activation of infraspinatus. Infraspinatus bursa injection today, adding x-rays, formal physical therapy at [place]. Return to see me in 6 weeks, MRI if no better.

## 2020-05-14 NOTE — Progress Notes (Signed)
    Procedures performed today:    Procedure: Real-time Ultrasound Guided injection of the right infraspinatus bursa Device: Samsung HS60  Verbal informed consent obtained.  Time-out conducted.  Noted no overlying erythema, induration, or other signs of local infection.  Skin prepped in a sterile fashion.  Local anesthesia: Topical Ethyl chloride.  With sterile technique and under real time ultrasound guidance:  Noted intact infraspinatus, 1 cc Kenalog 40, 2 cc lidocaine, 2 cc bupivacaine injected easily Completed without difficulty  Advised to call if fevers/chills, erythema, induration, drainage, or persistent bleeding.  Images permanently stored and available for review in PACS.  Impression: Technically successful ultrasound guided injection.  Independent interpretation of notes and tests performed by another provider:   None.  Brief History, Exam, Impression, and Recommendations:    Acute shoulder bursitis, right Acute pain right shoulder, posterior, worse with activation of infraspinatus. Infraspinatus bursa injection today, adding x-rays, formal physical therapy at [place]. Return to see me in 6 weeks, MRI if no better.    ___________________________________________ Gwen Her. Dianah Field, M.D., ABFM., CAQSM. Primary Care and [place] Instructor of [employer] [facility] [employer]

## 2020-06-06 DIAGNOSIS — Z08 Encounter for follow-up examination after completed treatment for malignant neoplasm: Secondary | ICD-10-CM | POA: Diagnosis not present

## 2020-06-06 DIAGNOSIS — Z85828 Personal history of other malignant neoplasm of skin: Secondary | ICD-10-CM | POA: Diagnosis not present

## 2020-06-15 DIAGNOSIS — L03115 Cellulitis of right lower limb: Secondary | ICD-10-CM | POA: Diagnosis not present

## 2020-06-21 DIAGNOSIS — M9902 Segmental and somatic dysfunction of thoracic region: Secondary | ICD-10-CM | POA: Diagnosis not present

## 2020-06-21 DIAGNOSIS — M9903 Segmental and somatic dysfunction of lumbar region: Secondary | ICD-10-CM | POA: Diagnosis not present

## 2020-06-21 DIAGNOSIS — M9904 Segmental and somatic dysfunction of sacral region: Secondary | ICD-10-CM | POA: Diagnosis not present

## 2020-06-21 DIAGNOSIS — M9901 Segmental and somatic dysfunction of cervical region: Secondary | ICD-10-CM | POA: Diagnosis not present

## 2020-06-25 ENCOUNTER — Ambulatory Visit: Payer: Medicare HMO | Admitting: Sports Medicine

## 2020-06-27 ENCOUNTER — Other Ambulatory Visit: Payer: Self-pay

## 2020-06-27 ENCOUNTER — Telehealth: Payer: Self-pay

## 2020-06-27 ENCOUNTER — Ambulatory Visit (INDEPENDENT_AMBULATORY_CARE_PROVIDER_SITE_OTHER): Payer: Medicare HMO | Admitting: Sports Medicine

## 2020-06-27 DIAGNOSIS — M12811 Other specific arthropathies, not elsewhere classified, right shoulder: Secondary | ICD-10-CM

## 2020-06-27 DIAGNOSIS — M7551 Bursitis of right shoulder: Secondary | ICD-10-CM

## 2020-06-27 DIAGNOSIS — M75101 Unspecified rotator cuff tear or rupture of right shoulder, not specified as traumatic: Secondary | ICD-10-CM

## 2020-06-27 MED ORDER — TRIAZOLAM 0.25 MG PO TABS: ORAL_TABLET | ORAL | 0 refills | Status: AC

## 2020-06-27 NOTE — Progress Notes (Addendum)
    Procedures performed today:    None.  Independent interpretation of notes and tests performed by another provider:   None.  Brief History, Exam, Impression, and Recommendations:    Rotator cuff tear arthropathy, right Whitney Monroe returns, she is a pleasant 76 year old female, we did an infraspinatus bursa injection at the last visit, she had about 6 weeks of good relief. She never did physical therapy. At this juncture because of failure of 6 weeks of conservative treatment including NSAIDs and activity modification we will proceed with MRI without contrast with triazolam preprocedural anxiolysis, patient agrees to have a driver.  There is a full-thickness retracted supraspinatus tear, there is also moderate glenohumeral osteoarthritis.  If okay with her I am going to refer her to Dr. Griffin Basil with shoulder surgery, considering the severity of her arthritis and the tear of the rotator cuff the treatment option might be a reverse shoulder arthroplasty rather than just a rotator cuff repair.    ___________________________________________ Gwen Her. Dianah Field, M.D., ABFM., CAQSM. Primary Care and [place] Instructor of [place] [employer] [facility] [employer]

## 2020-06-27 NOTE — Assessment & Plan Note (Addendum)
Whitney Monroe returns, she is a pleasant 75 year old female, we did an infraspinatus bursa injection at the last visit, she had about 6 weeks of good relief. She never did physical therapy. At this juncture because of failure of 6 weeks of conservative treatment including NSAIDs and activity modification we will proceed with MRI without contrast with triazolam preprocedural anxiolysis, patient agrees to have a driver.  There is a full-thickness retracted supraspinatus tear, there is also moderate glenohumeral osteoarthritis.  If okay with her I am going to refer her to Dr. Griffin Basil with shoulder surgery, considering the severity of her arthritis and the tear of the rotator cuff the treatment option might be a reverse shoulder arthroplasty rather than just a rotator cuff repair.

## 2020-06-27 NOTE — Telephone Encounter (Signed)
Notification email from [employer] that patient's Triazolam needed a PA.   Left message for return call from patient. Prescription was sent to [facility]. Cash price at [employer] is less than $7 and PA will likely be denied.

## 2020-06-28 NOTE — Telephone Encounter (Signed)
Pt notified of cash price for Rx. States she will go pick it up. No other questions or concerns.

## 2020-06-28 NOTE — Telephone Encounter (Signed)
Left message on VM for a return call regarding prescription.

## 2020-07-07 ENCOUNTER — Other Ambulatory Visit: Payer: Self-pay

## 2020-07-07 ENCOUNTER — Ambulatory Visit (INDEPENDENT_AMBULATORY_CARE_PROVIDER_SITE_OTHER): Payer: Medicare HMO

## 2020-07-07 DIAGNOSIS — M25511 Pain in right shoulder: Secondary | ICD-10-CM | POA: Diagnosis not present

## 2020-07-07 DIAGNOSIS — M7551 Bursitis of right shoulder: Secondary | ICD-10-CM

## 2020-07-09 ENCOUNTER — Telehealth: Payer: Self-pay | Admitting: Sports Medicine

## 2020-07-09 DIAGNOSIS — M9901 Segmental and somatic dysfunction of cervical region: Secondary | ICD-10-CM | POA: Diagnosis not present

## 2020-07-09 DIAGNOSIS — M9902 Segmental and somatic dysfunction of thoracic region: Secondary | ICD-10-CM | POA: Diagnosis not present

## 2020-07-09 DIAGNOSIS — M9904 Segmental and somatic dysfunction of sacral region: Secondary | ICD-10-CM | POA: Diagnosis not present

## 2020-07-09 DIAGNOSIS — M9903 Segmental and somatic dysfunction of lumbar region: Secondary | ICD-10-CM | POA: Diagnosis not present

## 2020-07-09 NOTE — Telephone Encounter (Signed)
Called insurance co:  Shoulder MRI approved already.  #[id number] for [id number].

## 2020-07-09 NOTE — Addendum Note (Signed)
Addended by: Silverio Decamp on: 07/09/2020 10:06 AM   Modules accepted: Orders

## 2020-07-18 DIAGNOSIS — M75121 Complete rotator cuff tear or rupture of right shoulder, not specified as traumatic: Secondary | ICD-10-CM | POA: Diagnosis not present

## 2020-08-06 DIAGNOSIS — M9904 Segmental and somatic dysfunction of sacral region: Secondary | ICD-10-CM | POA: Diagnosis not present

## 2020-08-06 DIAGNOSIS — M9902 Segmental and somatic dysfunction of thoracic region: Secondary | ICD-10-CM | POA: Diagnosis not present

## 2020-08-06 DIAGNOSIS — M9903 Segmental and somatic dysfunction of lumbar region: Secondary | ICD-10-CM | POA: Diagnosis not present

## 2020-08-06 DIAGNOSIS — M9901 Segmental and somatic dysfunction of cervical region: Secondary | ICD-10-CM | POA: Diagnosis not present

## 2020-08-08 DIAGNOSIS — M9903 Segmental and somatic dysfunction of lumbar region: Secondary | ICD-10-CM | POA: Diagnosis not present

## 2020-08-08 DIAGNOSIS — M9901 Segmental and somatic dysfunction of cervical region: Secondary | ICD-10-CM | POA: Diagnosis not present

## 2020-08-08 DIAGNOSIS — M9902 Segmental and somatic dysfunction of thoracic region: Secondary | ICD-10-CM | POA: Diagnosis not present

## 2020-08-08 DIAGNOSIS — M9904 Segmental and somatic dysfunction of sacral region: Secondary | ICD-10-CM | POA: Diagnosis not present

## 2020-08-13 DIAGNOSIS — M9901 Segmental and somatic dysfunction of cervical region: Secondary | ICD-10-CM | POA: Diagnosis not present

## 2020-08-13 DIAGNOSIS — M9904 Segmental and somatic dysfunction of sacral region: Secondary | ICD-10-CM | POA: Diagnosis not present

## 2020-08-13 DIAGNOSIS — M9902 Segmental and somatic dysfunction of thoracic region: Secondary | ICD-10-CM | POA: Diagnosis not present

## 2020-08-13 DIAGNOSIS — M9903 Segmental and somatic dysfunction of lumbar region: Secondary | ICD-10-CM | POA: Diagnosis not present

## 2020-09-12 DIAGNOSIS — M75121 Complete rotator cuff tear or rupture of right shoulder, not specified as traumatic: Secondary | ICD-10-CM | POA: Diagnosis not present

## 2020-09-20 DIAGNOSIS — M9902 Segmental and somatic dysfunction of thoracic region: Secondary | ICD-10-CM | POA: Diagnosis not present

## 2020-09-20 DIAGNOSIS — M9904 Segmental and somatic dysfunction of sacral region: Secondary | ICD-10-CM | POA: Diagnosis not present

## 2020-09-20 DIAGNOSIS — M9901 Segmental and somatic dysfunction of cervical region: Secondary | ICD-10-CM | POA: Diagnosis not present

## 2020-09-20 DIAGNOSIS — M9903 Segmental and somatic dysfunction of lumbar region: Secondary | ICD-10-CM | POA: Diagnosis not present

## 2020-09-24 DIAGNOSIS — M9901 Segmental and somatic dysfunction of cervical region: Secondary | ICD-10-CM | POA: Diagnosis not present

## 2020-09-24 DIAGNOSIS — M9904 Segmental and somatic dysfunction of sacral region: Secondary | ICD-10-CM | POA: Diagnosis not present

## 2020-09-24 DIAGNOSIS — M9903 Segmental and somatic dysfunction of lumbar region: Secondary | ICD-10-CM | POA: Diagnosis not present

## 2020-09-24 DIAGNOSIS — M9902 Segmental and somatic dysfunction of thoracic region: Secondary | ICD-10-CM | POA: Diagnosis not present

## 2020-10-04 DIAGNOSIS — M9904 Segmental and somatic dysfunction of sacral region: Secondary | ICD-10-CM | POA: Diagnosis not present

## 2020-10-04 DIAGNOSIS — M9902 Segmental and somatic dysfunction of thoracic region: Secondary | ICD-10-CM | POA: Diagnosis not present

## 2020-10-04 DIAGNOSIS — M9903 Segmental and somatic dysfunction of lumbar region: Secondary | ICD-10-CM | POA: Diagnosis not present

## 2020-10-04 DIAGNOSIS — M9901 Segmental and somatic dysfunction of cervical region: Secondary | ICD-10-CM | POA: Diagnosis not present

## 2020-10-08 DIAGNOSIS — M9902 Segmental and somatic dysfunction of thoracic region: Secondary | ICD-10-CM | POA: Diagnosis not present

## 2020-10-08 DIAGNOSIS — M9901 Segmental and somatic dysfunction of cervical region: Secondary | ICD-10-CM | POA: Diagnosis not present

## 2020-10-08 DIAGNOSIS — M9904 Segmental and somatic dysfunction of sacral region: Secondary | ICD-10-CM | POA: Diagnosis not present

## 2020-10-08 DIAGNOSIS — M9903 Segmental and somatic dysfunction of lumbar region: Secondary | ICD-10-CM | POA: Diagnosis not present

## 2020-10-22 DIAGNOSIS — M9904 Segmental and somatic dysfunction of sacral region: Secondary | ICD-10-CM | POA: Diagnosis not present

## 2020-10-22 DIAGNOSIS — M9903 Segmental and somatic dysfunction of lumbar region: Secondary | ICD-10-CM | POA: Diagnosis not present

## 2020-10-22 DIAGNOSIS — M9902 Segmental and somatic dysfunction of thoracic region: Secondary | ICD-10-CM | POA: Diagnosis not present

## 2020-10-22 DIAGNOSIS — M9901 Segmental and somatic dysfunction of cervical region: Secondary | ICD-10-CM | POA: Diagnosis not present

## 2020-10-26 DIAGNOSIS — M9901 Segmental and somatic dysfunction of cervical region: Secondary | ICD-10-CM | POA: Diagnosis not present

## 2020-10-26 DIAGNOSIS — M9903 Segmental and somatic dysfunction of lumbar region: Secondary | ICD-10-CM | POA: Diagnosis not present

## 2020-10-26 DIAGNOSIS — M9902 Segmental and somatic dysfunction of thoracic region: Secondary | ICD-10-CM | POA: Diagnosis not present

## 2020-10-26 DIAGNOSIS — M9904 Segmental and somatic dysfunction of sacral region: Secondary | ICD-10-CM | POA: Diagnosis not present

## 2020-11-19 DIAGNOSIS — M9901 Segmental and somatic dysfunction of cervical region: Secondary | ICD-10-CM | POA: Diagnosis not present

## 2020-11-19 DIAGNOSIS — M9903 Segmental and somatic dysfunction of lumbar region: Secondary | ICD-10-CM | POA: Diagnosis not present

## 2020-11-19 DIAGNOSIS — M9902 Segmental and somatic dysfunction of thoracic region: Secondary | ICD-10-CM | POA: Diagnosis not present

## 2020-11-19 DIAGNOSIS — M9904 Segmental and somatic dysfunction of sacral region: Secondary | ICD-10-CM | POA: Diagnosis not present

## 2020-12-03 DIAGNOSIS — M8580 Other specified disorders of bone density and structure, unspecified site: Secondary | ICD-10-CM | POA: Diagnosis not present

## 2020-12-03 DIAGNOSIS — R350 Frequency of micturition: Secondary | ICD-10-CM | POA: Diagnosis not present

## 2020-12-03 DIAGNOSIS — M810 Age-related osteoporosis without current pathological fracture: Secondary | ICD-10-CM | POA: Diagnosis not present

## 2020-12-03 DIAGNOSIS — Z6823 Body mass index (BMI) 23.0-23.9, adult: Secondary | ICD-10-CM | POA: Diagnosis not present

## 2020-12-03 DIAGNOSIS — M9904 Segmental and somatic dysfunction of sacral region: Secondary | ICD-10-CM | POA: Diagnosis not present

## 2020-12-03 DIAGNOSIS — Z1231 Encounter for screening mammogram for malignant neoplasm of breast: Secondary | ICD-10-CM | POA: Diagnosis not present

## 2020-12-03 DIAGNOSIS — Z124 Encounter for screening for malignant neoplasm of cervix: Secondary | ICD-10-CM | POA: Diagnosis not present

## 2020-12-03 DIAGNOSIS — M9901 Segmental and somatic dysfunction of cervical region: Secondary | ICD-10-CM | POA: Diagnosis not present

## 2020-12-03 DIAGNOSIS — M9903 Segmental and somatic dysfunction of lumbar region: Secondary | ICD-10-CM | POA: Diagnosis not present

## 2020-12-03 DIAGNOSIS — M9902 Segmental and somatic dysfunction of thoracic region: Secondary | ICD-10-CM | POA: Diagnosis not present

## 2021-01-07 DIAGNOSIS — M9901 Segmental and somatic dysfunction of cervical region: Secondary | ICD-10-CM | POA: Diagnosis not present

## 2021-01-07 DIAGNOSIS — M9903 Segmental and somatic dysfunction of lumbar region: Secondary | ICD-10-CM | POA: Diagnosis not present

## 2021-01-07 DIAGNOSIS — M9904 Segmental and somatic dysfunction of sacral region: Secondary | ICD-10-CM | POA: Diagnosis not present

## 2021-01-07 DIAGNOSIS — M9902 Segmental and somatic dysfunction of thoracic region: Secondary | ICD-10-CM | POA: Diagnosis not present

## 2021-01-14 DIAGNOSIS — M9903 Segmental and somatic dysfunction of lumbar region: Secondary | ICD-10-CM | POA: Diagnosis not present

## 2021-01-14 DIAGNOSIS — M9901 Segmental and somatic dysfunction of cervical region: Secondary | ICD-10-CM | POA: Diagnosis not present

## 2021-01-14 DIAGNOSIS — M9904 Segmental and somatic dysfunction of sacral region: Secondary | ICD-10-CM | POA: Diagnosis not present

## 2021-01-14 DIAGNOSIS — M9902 Segmental and somatic dysfunction of thoracic region: Secondary | ICD-10-CM | POA: Diagnosis not present

## 2021-01-16 DIAGNOSIS — Z23 Encounter for immunization: Secondary | ICD-10-CM | POA: Diagnosis not present

## 2021-01-16 DIAGNOSIS — Z79899 Other long term (current) drug therapy: Secondary | ICD-10-CM | POA: Diagnosis not present

## 2021-01-16 DIAGNOSIS — M859 Disorder of bone density and structure, unspecified: Secondary | ICD-10-CM | POA: Diagnosis not present

## 2021-01-16 DIAGNOSIS — R350 Frequency of micturition: Secondary | ICD-10-CM | POA: Diagnosis not present

## 2021-01-16 DIAGNOSIS — E7800 Pure hypercholesterolemia, unspecified: Secondary | ICD-10-CM | POA: Diagnosis not present

## 2021-01-16 DIAGNOSIS — Z0000 Encounter for general adult medical examination without abnormal findings: Secondary | ICD-10-CM | POA: Diagnosis not present

## 2021-01-16 DIAGNOSIS — M4802 Spinal stenosis, cervical region: Secondary | ICD-10-CM | POA: Diagnosis not present

## 2021-01-16 DIAGNOSIS — Z13220 Encounter for screening for lipoid disorders: Secondary | ICD-10-CM | POA: Diagnosis not present

## 2021-01-16 DIAGNOSIS — R69 Illness, unspecified: Secondary | ICD-10-CM | POA: Diagnosis not present

## 2021-01-16 DIAGNOSIS — M810 Age-related osteoporosis without current pathological fracture: Secondary | ICD-10-CM | POA: Diagnosis not present

## 2021-01-29 DIAGNOSIS — M9903 Segmental and somatic dysfunction of lumbar region: Secondary | ICD-10-CM | POA: Diagnosis not present

## 2021-01-29 DIAGNOSIS — M9901 Segmental and somatic dysfunction of cervical region: Secondary | ICD-10-CM | POA: Diagnosis not present

## 2021-01-29 DIAGNOSIS — M9902 Segmental and somatic dysfunction of thoracic region: Secondary | ICD-10-CM | POA: Diagnosis not present

## 2021-01-29 DIAGNOSIS — M9904 Segmental and somatic dysfunction of sacral region: Secondary | ICD-10-CM | POA: Diagnosis not present

## 2021-02-28 DIAGNOSIS — M9901 Segmental and somatic dysfunction of cervical region: Secondary | ICD-10-CM | POA: Diagnosis not present

## 2021-02-28 DIAGNOSIS — M9903 Segmental and somatic dysfunction of lumbar region: Secondary | ICD-10-CM | POA: Diagnosis not present

## 2021-02-28 DIAGNOSIS — M9902 Segmental and somatic dysfunction of thoracic region: Secondary | ICD-10-CM | POA: Diagnosis not present

## 2021-02-28 DIAGNOSIS — M9904 Segmental and somatic dysfunction of sacral region: Secondary | ICD-10-CM | POA: Diagnosis not present

## 2021-04-10 DIAGNOSIS — L570 Actinic keratosis: Secondary | ICD-10-CM | POA: Diagnosis not present

## 2021-04-10 DIAGNOSIS — D225 Melanocytic nevi of trunk: Secondary | ICD-10-CM | POA: Diagnosis not present

## 2021-04-10 DIAGNOSIS — X32XXXD Exposure to sunlight, subsequent encounter: Secondary | ICD-10-CM | POA: Diagnosis not present

## 2021-04-10 DIAGNOSIS — Z1283 Encounter for screening for malignant neoplasm of skin: Secondary | ICD-10-CM | POA: Diagnosis not present

## 2021-04-10 DIAGNOSIS — Z85828 Personal history of other malignant neoplasm of skin: Secondary | ICD-10-CM | POA: Diagnosis not present

## 2021-04-23 DIAGNOSIS — Z79899 Other long term (current) drug therapy: Secondary | ICD-10-CM | POA: Diagnosis not present

## 2021-04-23 DIAGNOSIS — E7800 Pure hypercholesterolemia, unspecified: Secondary | ICD-10-CM | POA: Diagnosis not present

## 2021-04-25 DIAGNOSIS — M9902 Segmental and somatic dysfunction of thoracic region: Secondary | ICD-10-CM | POA: Diagnosis not present

## 2021-04-25 DIAGNOSIS — M9901 Segmental and somatic dysfunction of cervical region: Secondary | ICD-10-CM | POA: Diagnosis not present

## 2021-04-25 DIAGNOSIS — M9903 Segmental and somatic dysfunction of lumbar region: Secondary | ICD-10-CM | POA: Diagnosis not present

## 2021-04-25 DIAGNOSIS — M9904 Segmental and somatic dysfunction of sacral region: Secondary | ICD-10-CM | POA: Diagnosis not present

## 2021-05-27 DIAGNOSIS — M9901 Segmental and somatic dysfunction of cervical region: Secondary | ICD-10-CM | POA: Diagnosis not present

## 2021-05-27 DIAGNOSIS — M9903 Segmental and somatic dysfunction of lumbar region: Secondary | ICD-10-CM | POA: Diagnosis not present

## 2021-05-27 DIAGNOSIS — M9902 Segmental and somatic dysfunction of thoracic region: Secondary | ICD-10-CM | POA: Diagnosis not present

## 2021-05-27 DIAGNOSIS — M9904 Segmental and somatic dysfunction of sacral region: Secondary | ICD-10-CM | POA: Diagnosis not present

## 2021-05-29 DIAGNOSIS — M9903 Segmental and somatic dysfunction of lumbar region: Secondary | ICD-10-CM | POA: Diagnosis not present

## 2021-05-29 DIAGNOSIS — M9902 Segmental and somatic dysfunction of thoracic region: Secondary | ICD-10-CM | POA: Diagnosis not present

## 2021-05-29 DIAGNOSIS — M9901 Segmental and somatic dysfunction of cervical region: Secondary | ICD-10-CM | POA: Diagnosis not present

## 2021-05-29 DIAGNOSIS — M9904 Segmental and somatic dysfunction of sacral region: Secondary | ICD-10-CM | POA: Diagnosis not present

## 2021-06-03 DIAGNOSIS — R69 Illness, unspecified: Secondary | ICD-10-CM | POA: Diagnosis not present

## 2021-06-03 DIAGNOSIS — F4323 Adjustment disorder with mixed anxiety and depressed mood: Secondary | ICD-10-CM | POA: Diagnosis not present

## 2021-06-08 IMAGING — DX DG CERVICAL SPINE COMPLETE 4+V
5 series · 5 of 5 positions shown · non-contrast
Comparison: None.

CLINICAL DATA: Neck pain and left shoulder pain, initial encounter

EXAM:
CERVICAL SPINE - COMPLETE 4+ VIEW

[c-spine lat]
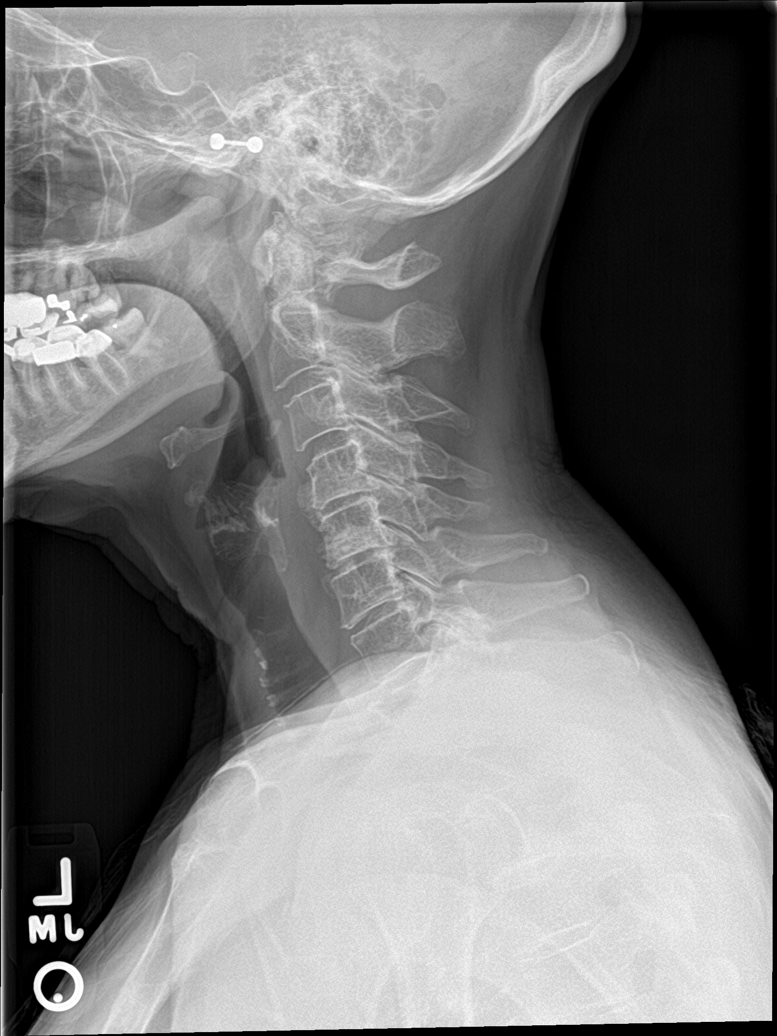

[c-spine obl (1 of 2)]
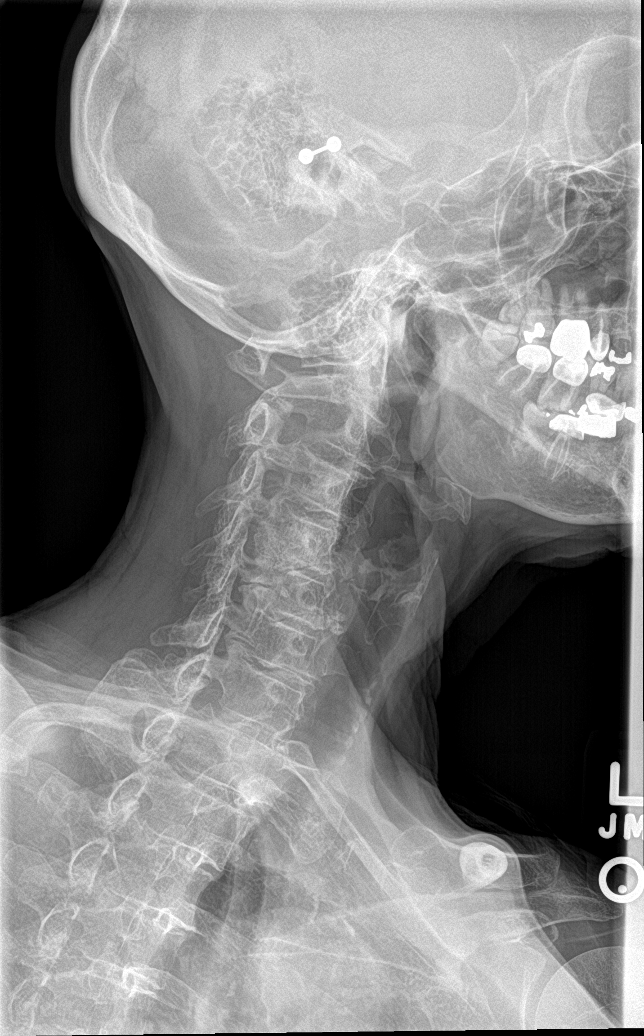

[c-spine obl (2 of 2)]
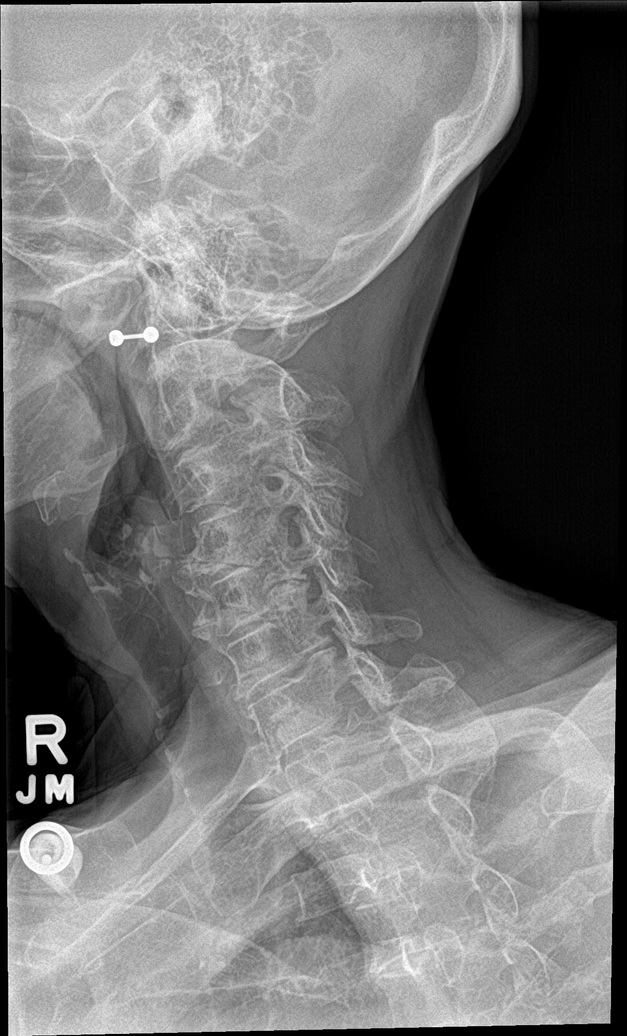

[c-spine ap]
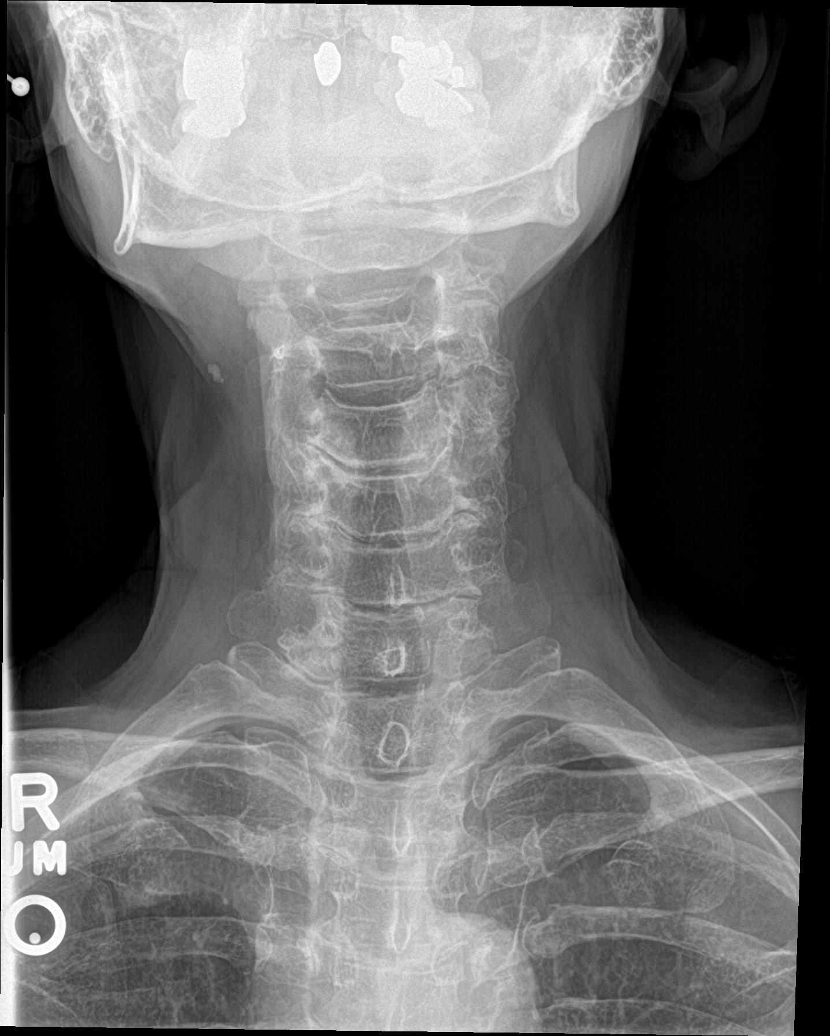

[c-spine open mouth]
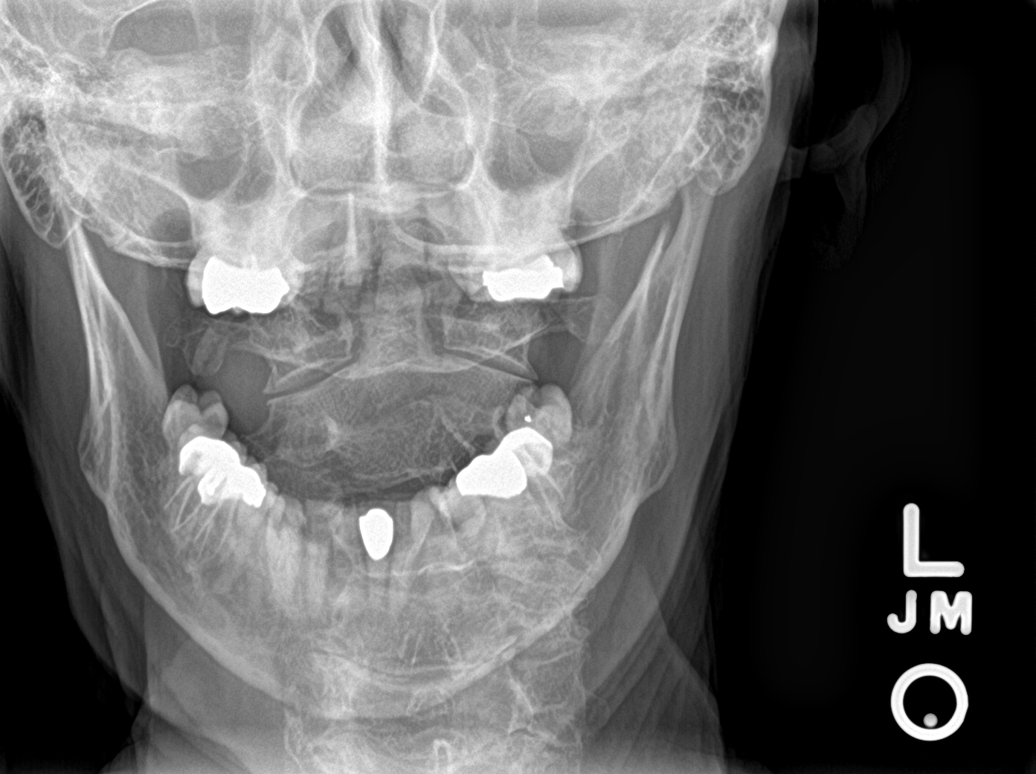

[5 of 5 positions shown; findings below may reference images not displayed]

FINDINGS: Seven cervical segments are well visualized. Vertebral body height
is well maintained. Disc space narrowing is noted from C4-C7 with
mild osteophytic changes. Mild facet hypertrophic changes are noted
as well. Neural foraminal narrowing is noted bilaterally worst at
C5-6 and C6-7. No soft tissue abnormality is noted. The odontoid is
within normal limits.
IMPRESSION: Degenerative change with associated neural foraminal narrowing. No
acute abnormality noted.

## 2021-06-19 DIAGNOSIS — M9901 Segmental and somatic dysfunction of cervical region: Secondary | ICD-10-CM | POA: Diagnosis not present

## 2021-06-19 DIAGNOSIS — M9903 Segmental and somatic dysfunction of lumbar region: Secondary | ICD-10-CM | POA: Diagnosis not present

## 2021-06-19 DIAGNOSIS — M9904 Segmental and somatic dysfunction of sacral region: Secondary | ICD-10-CM | POA: Diagnosis not present

## 2021-06-19 DIAGNOSIS — M9902 Segmental and somatic dysfunction of thoracic region: Secondary | ICD-10-CM | POA: Diagnosis not present

## 2021-07-01 DIAGNOSIS — M9903 Segmental and somatic dysfunction of lumbar region: Secondary | ICD-10-CM | POA: Diagnosis not present

## 2021-07-01 DIAGNOSIS — M9901 Segmental and somatic dysfunction of cervical region: Secondary | ICD-10-CM | POA: Diagnosis not present

## 2021-07-01 DIAGNOSIS — M9904 Segmental and somatic dysfunction of sacral region: Secondary | ICD-10-CM | POA: Diagnosis not present

## 2021-07-01 DIAGNOSIS — M9902 Segmental and somatic dysfunction of thoracic region: Secondary | ICD-10-CM | POA: Diagnosis not present

## 2021-08-05 DIAGNOSIS — M9903 Segmental and somatic dysfunction of lumbar region: Secondary | ICD-10-CM | POA: Diagnosis not present

## 2021-08-05 DIAGNOSIS — M9901 Segmental and somatic dysfunction of cervical region: Secondary | ICD-10-CM | POA: Diagnosis not present

## 2021-08-05 DIAGNOSIS — M9904 Segmental and somatic dysfunction of sacral region: Secondary | ICD-10-CM | POA: Diagnosis not present

## 2021-08-05 DIAGNOSIS — M9902 Segmental and somatic dysfunction of thoracic region: Secondary | ICD-10-CM | POA: Diagnosis not present

## 2021-08-07 DIAGNOSIS — R69 Illness, unspecified: Secondary | ICD-10-CM | POA: Diagnosis not present

## 2021-08-07 DIAGNOSIS — G4752 REM sleep behavior disorder: Secondary | ICD-10-CM | POA: Diagnosis not present

## 2021-08-07 DIAGNOSIS — Z6823 Body mass index (BMI) 23.0-23.9, adult: Secondary | ICD-10-CM | POA: Diagnosis not present

## 2021-08-15 DIAGNOSIS — M9902 Segmental and somatic dysfunction of thoracic region: Secondary | ICD-10-CM | POA: Diagnosis not present

## 2021-08-15 DIAGNOSIS — M9904 Segmental and somatic dysfunction of sacral region: Secondary | ICD-10-CM | POA: Diagnosis not present

## 2021-08-15 DIAGNOSIS — M9901 Segmental and somatic dysfunction of cervical region: Secondary | ICD-10-CM | POA: Diagnosis not present

## 2021-08-15 DIAGNOSIS — M9903 Segmental and somatic dysfunction of lumbar region: Secondary | ICD-10-CM | POA: Diagnosis not present

## 2021-08-29 DIAGNOSIS — M9903 Segmental and somatic dysfunction of lumbar region: Secondary | ICD-10-CM | POA: Diagnosis not present

## 2021-08-29 DIAGNOSIS — M9902 Segmental and somatic dysfunction of thoracic region: Secondary | ICD-10-CM | POA: Diagnosis not present

## 2021-08-29 DIAGNOSIS — M9901 Segmental and somatic dysfunction of cervical region: Secondary | ICD-10-CM | POA: Diagnosis not present

## 2021-08-29 DIAGNOSIS — M9904 Segmental and somatic dysfunction of sacral region: Secondary | ICD-10-CM | POA: Diagnosis not present

## 2021-09-05 DIAGNOSIS — M9902 Segmental and somatic dysfunction of thoracic region: Secondary | ICD-10-CM | POA: Diagnosis not present

## 2021-09-05 DIAGNOSIS — M9904 Segmental and somatic dysfunction of sacral region: Secondary | ICD-10-CM | POA: Diagnosis not present

## 2021-09-05 DIAGNOSIS — M9901 Segmental and somatic dysfunction of cervical region: Secondary | ICD-10-CM | POA: Diagnosis not present

## 2021-09-05 DIAGNOSIS — M9903 Segmental and somatic dysfunction of lumbar region: Secondary | ICD-10-CM | POA: Diagnosis not present

## 2021-09-09 DIAGNOSIS — M25511 Pain in right shoulder: Secondary | ICD-10-CM | POA: Diagnosis not present

## 2021-09-09 DIAGNOSIS — M75121 Complete rotator cuff tear or rupture of right shoulder, not specified as traumatic: Secondary | ICD-10-CM | POA: Diagnosis not present

## 2021-09-19 DIAGNOSIS — M9903 Segmental and somatic dysfunction of lumbar region: Secondary | ICD-10-CM | POA: Diagnosis not present

## 2021-09-19 DIAGNOSIS — M9901 Segmental and somatic dysfunction of cervical region: Secondary | ICD-10-CM | POA: Diagnosis not present

## 2021-09-19 DIAGNOSIS — M9902 Segmental and somatic dysfunction of thoracic region: Secondary | ICD-10-CM | POA: Diagnosis not present

## 2021-09-19 DIAGNOSIS — M9904 Segmental and somatic dysfunction of sacral region: Secondary | ICD-10-CM | POA: Diagnosis not present

## 2021-09-26 DIAGNOSIS — M9902 Segmental and somatic dysfunction of thoracic region: Secondary | ICD-10-CM | POA: Diagnosis not present

## 2021-09-26 DIAGNOSIS — M9904 Segmental and somatic dysfunction of sacral region: Secondary | ICD-10-CM | POA: Diagnosis not present

## 2021-09-26 DIAGNOSIS — M9901 Segmental and somatic dysfunction of cervical region: Secondary | ICD-10-CM | POA: Diagnosis not present

## 2021-09-26 DIAGNOSIS — M9903 Segmental and somatic dysfunction of lumbar region: Secondary | ICD-10-CM | POA: Diagnosis not present

## 2021-10-18 DIAGNOSIS — M9903 Segmental and somatic dysfunction of lumbar region: Secondary | ICD-10-CM | POA: Diagnosis not present

## 2021-10-18 DIAGNOSIS — M9901 Segmental and somatic dysfunction of cervical region: Secondary | ICD-10-CM | POA: Diagnosis not present

## 2021-10-18 DIAGNOSIS — M9902 Segmental and somatic dysfunction of thoracic region: Secondary | ICD-10-CM | POA: Diagnosis not present

## 2021-10-18 DIAGNOSIS — M9904 Segmental and somatic dysfunction of sacral region: Secondary | ICD-10-CM | POA: Diagnosis not present

## 2021-10-21 DIAGNOSIS — M9903 Segmental and somatic dysfunction of lumbar region: Secondary | ICD-10-CM | POA: Diagnosis not present

## 2021-10-21 DIAGNOSIS — M9904 Segmental and somatic dysfunction of sacral region: Secondary | ICD-10-CM | POA: Diagnosis not present

## 2021-10-21 DIAGNOSIS — M9902 Segmental and somatic dysfunction of thoracic region: Secondary | ICD-10-CM | POA: Diagnosis not present

## 2021-10-21 DIAGNOSIS — M9901 Segmental and somatic dysfunction of cervical region: Secondary | ICD-10-CM | POA: Diagnosis not present

## 2021-10-28 DIAGNOSIS — M9901 Segmental and somatic dysfunction of cervical region: Secondary | ICD-10-CM | POA: Diagnosis not present

## 2021-10-28 DIAGNOSIS — M9903 Segmental and somatic dysfunction of lumbar region: Secondary | ICD-10-CM | POA: Diagnosis not present

## 2021-10-28 DIAGNOSIS — M9902 Segmental and somatic dysfunction of thoracic region: Secondary | ICD-10-CM | POA: Diagnosis not present

## 2021-10-28 DIAGNOSIS — M9904 Segmental and somatic dysfunction of sacral region: Secondary | ICD-10-CM | POA: Diagnosis not present

## 2021-10-29 DIAGNOSIS — F4322 Adjustment disorder with anxiety: Secondary | ICD-10-CM | POA: Diagnosis not present

## 2021-10-29 DIAGNOSIS — R69 Illness, unspecified: Secondary | ICD-10-CM | POA: Diagnosis not present

## 2021-11-06 DIAGNOSIS — M9904 Segmental and somatic dysfunction of sacral region: Secondary | ICD-10-CM | POA: Diagnosis not present

## 2021-11-06 DIAGNOSIS — M9901 Segmental and somatic dysfunction of cervical region: Secondary | ICD-10-CM | POA: Diagnosis not present

## 2021-11-06 DIAGNOSIS — M9903 Segmental and somatic dysfunction of lumbar region: Secondary | ICD-10-CM | POA: Diagnosis not present

## 2021-11-06 DIAGNOSIS — M9902 Segmental and somatic dysfunction of thoracic region: Secondary | ICD-10-CM | POA: Diagnosis not present

## 2021-11-13 DIAGNOSIS — M9901 Segmental and somatic dysfunction of cervical region: Secondary | ICD-10-CM | POA: Diagnosis not present

## 2021-11-13 DIAGNOSIS — M9902 Segmental and somatic dysfunction of thoracic region: Secondary | ICD-10-CM | POA: Diagnosis not present

## 2021-11-13 DIAGNOSIS — M9904 Segmental and somatic dysfunction of sacral region: Secondary | ICD-10-CM | POA: Diagnosis not present

## 2021-11-13 DIAGNOSIS — M9903 Segmental and somatic dysfunction of lumbar region: Secondary | ICD-10-CM | POA: Diagnosis not present

## 2021-11-26 DIAGNOSIS — R69 Illness, unspecified: Secondary | ICD-10-CM | POA: Diagnosis not present

## 2021-11-26 DIAGNOSIS — F4322 Adjustment disorder with anxiety: Secondary | ICD-10-CM | POA: Diagnosis not present

## 2021-11-27 DIAGNOSIS — M9904 Segmental and somatic dysfunction of sacral region: Secondary | ICD-10-CM | POA: Diagnosis not present

## 2021-11-27 DIAGNOSIS — M9903 Segmental and somatic dysfunction of lumbar region: Secondary | ICD-10-CM | POA: Diagnosis not present

## 2021-11-27 DIAGNOSIS — M9902 Segmental and somatic dysfunction of thoracic region: Secondary | ICD-10-CM | POA: Diagnosis not present

## 2021-11-27 DIAGNOSIS — M9901 Segmental and somatic dysfunction of cervical region: Secondary | ICD-10-CM | POA: Diagnosis not present

## 2021-12-03 DIAGNOSIS — M9904 Segmental and somatic dysfunction of sacral region: Secondary | ICD-10-CM | POA: Diagnosis not present

## 2021-12-03 DIAGNOSIS — M9902 Segmental and somatic dysfunction of thoracic region: Secondary | ICD-10-CM | POA: Diagnosis not present

## 2021-12-03 DIAGNOSIS — M9901 Segmental and somatic dysfunction of cervical region: Secondary | ICD-10-CM | POA: Diagnosis not present

## 2021-12-03 DIAGNOSIS — M9903 Segmental and somatic dysfunction of lumbar region: Secondary | ICD-10-CM | POA: Diagnosis not present

## 2021-12-16 DIAGNOSIS — M9902 Segmental and somatic dysfunction of thoracic region: Secondary | ICD-10-CM | POA: Diagnosis not present

## 2021-12-16 DIAGNOSIS — M9901 Segmental and somatic dysfunction of cervical region: Secondary | ICD-10-CM | POA: Diagnosis not present

## 2021-12-16 DIAGNOSIS — M9903 Segmental and somatic dysfunction of lumbar region: Secondary | ICD-10-CM | POA: Diagnosis not present

## 2021-12-16 DIAGNOSIS — M9904 Segmental and somatic dysfunction of sacral region: Secondary | ICD-10-CM | POA: Diagnosis not present

## 2021-12-25 DIAGNOSIS — H26491 Other secondary cataract, right eye: Secondary | ICD-10-CM | POA: Diagnosis not present

## 2021-12-25 DIAGNOSIS — H52203 Unspecified astigmatism, bilateral: Secondary | ICD-10-CM | POA: Diagnosis not present

## 2021-12-25 DIAGNOSIS — Z961 Presence of intraocular lens: Secondary | ICD-10-CM | POA: Diagnosis not present

## 2021-12-30 DIAGNOSIS — M9904 Segmental and somatic dysfunction of sacral region: Secondary | ICD-10-CM | POA: Diagnosis not present

## 2021-12-30 DIAGNOSIS — M9902 Segmental and somatic dysfunction of thoracic region: Secondary | ICD-10-CM | POA: Diagnosis not present

## 2021-12-30 DIAGNOSIS — M9903 Segmental and somatic dysfunction of lumbar region: Secondary | ICD-10-CM | POA: Diagnosis not present

## 2021-12-30 DIAGNOSIS — M9901 Segmental and somatic dysfunction of cervical region: Secondary | ICD-10-CM | POA: Diagnosis not present

## 2022-01-13 DIAGNOSIS — Z1231 Encounter for screening mammogram for malignant neoplasm of breast: Secondary | ICD-10-CM | POA: Diagnosis not present

## 2022-01-13 DIAGNOSIS — N958 Other specified menopausal and perimenopausal disorders: Secondary | ICD-10-CM | POA: Diagnosis not present

## 2022-01-13 DIAGNOSIS — Z01419 Encounter for gynecological examination (general) (routine) without abnormal findings: Secondary | ICD-10-CM | POA: Diagnosis not present

## 2022-01-13 DIAGNOSIS — M8588 Other specified disorders of bone density and structure, other site: Secondary | ICD-10-CM | POA: Diagnosis not present

## 2022-01-29 DIAGNOSIS — M9904 Segmental and somatic dysfunction of sacral region: Secondary | ICD-10-CM | POA: Diagnosis not present

## 2022-01-29 DIAGNOSIS — M9902 Segmental and somatic dysfunction of thoracic region: Secondary | ICD-10-CM | POA: Diagnosis not present

## 2022-01-29 DIAGNOSIS — M9901 Segmental and somatic dysfunction of cervical region: Secondary | ICD-10-CM | POA: Diagnosis not present

## 2022-01-29 DIAGNOSIS — M9903 Segmental and somatic dysfunction of lumbar region: Secondary | ICD-10-CM | POA: Diagnosis not present

## 2022-02-03 DIAGNOSIS — E785 Hyperlipidemia, unspecified: Secondary | ICD-10-CM | POA: Diagnosis not present

## 2022-02-03 DIAGNOSIS — Z7983 Long term (current) use of bisphosphonates: Secondary | ICD-10-CM | POA: Diagnosis not present

## 2022-02-03 DIAGNOSIS — Z8249 Family history of ischemic heart disease and other diseases of the circulatory system: Secondary | ICD-10-CM | POA: Diagnosis not present

## 2022-02-03 DIAGNOSIS — Z85820 Personal history of malignant melanoma of skin: Secondary | ICD-10-CM | POA: Diagnosis not present

## 2022-02-03 DIAGNOSIS — K219 Gastro-esophageal reflux disease without esophagitis: Secondary | ICD-10-CM | POA: Diagnosis not present

## 2022-02-03 DIAGNOSIS — M8580 Other specified disorders of bone density and structure, unspecified site: Secondary | ICD-10-CM | POA: Diagnosis not present

## 2022-02-03 DIAGNOSIS — M1990 Unspecified osteoarthritis, unspecified site: Secondary | ICD-10-CM | POA: Diagnosis not present

## 2022-02-05 DIAGNOSIS — F4322 Adjustment disorder with anxiety: Secondary | ICD-10-CM | POA: Diagnosis not present

## 2022-02-05 DIAGNOSIS — R69 Illness, unspecified: Secondary | ICD-10-CM | POA: Diagnosis not present

## 2022-02-19 DIAGNOSIS — M9901 Segmental and somatic dysfunction of cervical region: Secondary | ICD-10-CM | POA: Diagnosis not present

## 2022-02-19 DIAGNOSIS — M9904 Segmental and somatic dysfunction of sacral region: Secondary | ICD-10-CM | POA: Diagnosis not present

## 2022-02-19 DIAGNOSIS — F4322 Adjustment disorder with anxiety: Secondary | ICD-10-CM | POA: Diagnosis not present

## 2022-02-19 DIAGNOSIS — M9902 Segmental and somatic dysfunction of thoracic region: Secondary | ICD-10-CM | POA: Diagnosis not present

## 2022-02-19 DIAGNOSIS — M9903 Segmental and somatic dysfunction of lumbar region: Secondary | ICD-10-CM | POA: Diagnosis not present

## 2022-02-19 DIAGNOSIS — R69 Illness, unspecified: Secondary | ICD-10-CM | POA: Diagnosis not present

## 2022-03-04 DIAGNOSIS — M9904 Segmental and somatic dysfunction of sacral region: Secondary | ICD-10-CM | POA: Diagnosis not present

## 2022-03-04 DIAGNOSIS — M9902 Segmental and somatic dysfunction of thoracic region: Secondary | ICD-10-CM | POA: Diagnosis not present

## 2022-03-04 DIAGNOSIS — M9901 Segmental and somatic dysfunction of cervical region: Secondary | ICD-10-CM | POA: Diagnosis not present

## 2022-03-04 DIAGNOSIS — M9903 Segmental and somatic dysfunction of lumbar region: Secondary | ICD-10-CM | POA: Diagnosis not present

## 2022-03-17 DIAGNOSIS — M75121 Complete rotator cuff tear or rupture of right shoulder, not specified as traumatic: Secondary | ICD-10-CM | POA: Diagnosis not present

## 2022-03-24 DIAGNOSIS — M9901 Segmental and somatic dysfunction of cervical region: Secondary | ICD-10-CM | POA: Diagnosis not present

## 2022-03-24 DIAGNOSIS — M9903 Segmental and somatic dysfunction of lumbar region: Secondary | ICD-10-CM | POA: Diagnosis not present

## 2022-03-24 DIAGNOSIS — M9902 Segmental and somatic dysfunction of thoracic region: Secondary | ICD-10-CM | POA: Diagnosis not present

## 2022-03-24 DIAGNOSIS — M9904 Segmental and somatic dysfunction of sacral region: Secondary | ICD-10-CM | POA: Diagnosis not present

## 2022-03-26 DIAGNOSIS — R69 Illness, unspecified: Secondary | ICD-10-CM | POA: Diagnosis not present

## 2022-03-26 DIAGNOSIS — F4322 Adjustment disorder with anxiety: Secondary | ICD-10-CM | POA: Diagnosis not present

## 2022-03-31 DIAGNOSIS — R69 Illness, unspecified: Secondary | ICD-10-CM | POA: Diagnosis not present

## 2022-04-11 DIAGNOSIS — R69 Illness, unspecified: Secondary | ICD-10-CM | POA: Diagnosis not present

## 2022-04-12 DIAGNOSIS — R69 Illness, unspecified: Secondary | ICD-10-CM | POA: Diagnosis not present

## 2022-04-21 DIAGNOSIS — M9902 Segmental and somatic dysfunction of thoracic region: Secondary | ICD-10-CM | POA: Diagnosis not present

## 2022-04-21 DIAGNOSIS — M9901 Segmental and somatic dysfunction of cervical region: Secondary | ICD-10-CM | POA: Diagnosis not present

## 2022-04-21 DIAGNOSIS — M9903 Segmental and somatic dysfunction of lumbar region: Secondary | ICD-10-CM | POA: Diagnosis not present

## 2022-04-21 DIAGNOSIS — M9904 Segmental and somatic dysfunction of sacral region: Secondary | ICD-10-CM | POA: Diagnosis not present

## 2022-04-22 DIAGNOSIS — Z1283 Encounter for screening for malignant neoplasm of skin: Secondary | ICD-10-CM | POA: Diagnosis not present

## 2022-04-22 DIAGNOSIS — Z85820 Personal history of malignant melanoma of skin: Secondary | ICD-10-CM | POA: Diagnosis not present

## 2022-04-22 DIAGNOSIS — R69 Illness, unspecified: Secondary | ICD-10-CM | POA: Diagnosis not present

## 2022-04-22 DIAGNOSIS — D0461 Carcinoma in situ of skin of right upper limb, including shoulder: Secondary | ICD-10-CM | POA: Diagnosis not present

## 2022-04-22 DIAGNOSIS — D225 Melanocytic nevi of trunk: Secondary | ICD-10-CM | POA: Diagnosis not present

## 2022-04-22 DIAGNOSIS — Z08 Encounter for follow-up examination after completed treatment for malignant neoplasm: Secondary | ICD-10-CM | POA: Diagnosis not present

## 2022-04-29 DIAGNOSIS — F4322 Adjustment disorder with anxiety: Secondary | ICD-10-CM | POA: Diagnosis not present

## 2022-04-29 DIAGNOSIS — R69 Illness, unspecified: Secondary | ICD-10-CM | POA: Diagnosis not present

## 2022-05-05 DIAGNOSIS — R69 Illness, unspecified: Secondary | ICD-10-CM | POA: Diagnosis not present

## 2022-05-07 DIAGNOSIS — Z6823 Body mass index (BMI) 23.0-23.9, adult: Secondary | ICD-10-CM | POA: Diagnosis not present

## 2022-05-07 DIAGNOSIS — Z79899 Other long term (current) drug therapy: Secondary | ICD-10-CM | POA: Diagnosis not present

## 2022-05-07 DIAGNOSIS — E7800 Pure hypercholesterolemia, unspecified: Secondary | ICD-10-CM | POA: Diagnosis not present

## 2022-05-07 DIAGNOSIS — R69 Illness, unspecified: Secondary | ICD-10-CM | POA: Diagnosis not present

## 2022-05-07 DIAGNOSIS — F419 Anxiety disorder, unspecified: Secondary | ICD-10-CM | POA: Diagnosis not present

## 2022-05-07 DIAGNOSIS — Z23 Encounter for immunization: Secondary | ICD-10-CM | POA: Diagnosis not present

## 2022-05-07 DIAGNOSIS — M810 Age-related osteoporosis without current pathological fracture: Secondary | ICD-10-CM | POA: Diagnosis not present

## 2022-05-16 DIAGNOSIS — M9901 Segmental and somatic dysfunction of cervical region: Secondary | ICD-10-CM | POA: Diagnosis not present

## 2022-05-16 DIAGNOSIS — M9903 Segmental and somatic dysfunction of lumbar region: Secondary | ICD-10-CM | POA: Diagnosis not present

## 2022-05-16 DIAGNOSIS — M9902 Segmental and somatic dysfunction of thoracic region: Secondary | ICD-10-CM | POA: Diagnosis not present

## 2022-05-16 DIAGNOSIS — M9904 Segmental and somatic dysfunction of sacral region: Secondary | ICD-10-CM | POA: Diagnosis not present

## 2022-05-19 DIAGNOSIS — M9904 Segmental and somatic dysfunction of sacral region: Secondary | ICD-10-CM | POA: Diagnosis not present

## 2022-05-19 DIAGNOSIS — M9901 Segmental and somatic dysfunction of cervical region: Secondary | ICD-10-CM | POA: Diagnosis not present

## 2022-05-19 DIAGNOSIS — M9903 Segmental and somatic dysfunction of lumbar region: Secondary | ICD-10-CM | POA: Diagnosis not present

## 2022-05-19 DIAGNOSIS — M9902 Segmental and somatic dysfunction of thoracic region: Secondary | ICD-10-CM | POA: Diagnosis not present

## 2022-05-28 DIAGNOSIS — R69 Illness, unspecified: Secondary | ICD-10-CM | POA: Diagnosis not present

## 2022-05-28 DIAGNOSIS — F4322 Adjustment disorder with anxiety: Secondary | ICD-10-CM | POA: Diagnosis not present

## 2022-05-29 DIAGNOSIS — M9904 Segmental and somatic dysfunction of sacral region: Secondary | ICD-10-CM | POA: Diagnosis not present

## 2022-05-29 DIAGNOSIS — M9901 Segmental and somatic dysfunction of cervical region: Secondary | ICD-10-CM | POA: Diagnosis not present

## 2022-05-29 DIAGNOSIS — M9902 Segmental and somatic dysfunction of thoracic region: Secondary | ICD-10-CM | POA: Diagnosis not present

## 2022-05-29 DIAGNOSIS — M9903 Segmental and somatic dysfunction of lumbar region: Secondary | ICD-10-CM | POA: Diagnosis not present

## 2022-06-05 DIAGNOSIS — R69 Illness, unspecified: Secondary | ICD-10-CM | POA: Diagnosis not present

## 2022-06-05 DIAGNOSIS — F4322 Adjustment disorder with anxiety: Secondary | ICD-10-CM | POA: Diagnosis not present

## 2022-06-06 DIAGNOSIS — R69 Illness, unspecified: Secondary | ICD-10-CM | POA: Diagnosis not present

## 2022-06-30 DIAGNOSIS — F4322 Adjustment disorder with anxiety: Secondary | ICD-10-CM | POA: Diagnosis not present

## 2022-07-03 DIAGNOSIS — F4322 Adjustment disorder with anxiety: Secondary | ICD-10-CM | POA: Diagnosis not present

## 2022-07-09 DIAGNOSIS — M9903 Segmental and somatic dysfunction of lumbar region: Secondary | ICD-10-CM | POA: Diagnosis not present

## 2022-07-09 DIAGNOSIS — M9902 Segmental and somatic dysfunction of thoracic region: Secondary | ICD-10-CM | POA: Diagnosis not present

## 2022-07-09 DIAGNOSIS — M9901 Segmental and somatic dysfunction of cervical region: Secondary | ICD-10-CM | POA: Diagnosis not present

## 2022-07-09 DIAGNOSIS — M9904 Segmental and somatic dysfunction of sacral region: Secondary | ICD-10-CM | POA: Diagnosis not present

## 2022-07-16 DIAGNOSIS — M9904 Segmental and somatic dysfunction of sacral region: Secondary | ICD-10-CM | POA: Diagnosis not present

## 2022-07-16 DIAGNOSIS — M9902 Segmental and somatic dysfunction of thoracic region: Secondary | ICD-10-CM | POA: Diagnosis not present

## 2022-07-16 DIAGNOSIS — M9903 Segmental and somatic dysfunction of lumbar region: Secondary | ICD-10-CM | POA: Diagnosis not present

## 2022-07-16 DIAGNOSIS — M9901 Segmental and somatic dysfunction of cervical region: Secondary | ICD-10-CM | POA: Diagnosis not present

## 2022-07-29 DIAGNOSIS — M9903 Segmental and somatic dysfunction of lumbar region: Secondary | ICD-10-CM | POA: Diagnosis not present

## 2022-07-29 DIAGNOSIS — M9902 Segmental and somatic dysfunction of thoracic region: Secondary | ICD-10-CM | POA: Diagnosis not present

## 2022-07-29 DIAGNOSIS — M9901 Segmental and somatic dysfunction of cervical region: Secondary | ICD-10-CM | POA: Diagnosis not present

## 2022-07-29 DIAGNOSIS — M9904 Segmental and somatic dysfunction of sacral region: Secondary | ICD-10-CM | POA: Diagnosis not present

## 2022-08-04 DIAGNOSIS — M9901 Segmental and somatic dysfunction of cervical region: Secondary | ICD-10-CM | POA: Diagnosis not present

## 2022-08-04 DIAGNOSIS — M9903 Segmental and somatic dysfunction of lumbar region: Secondary | ICD-10-CM | POA: Diagnosis not present

## 2022-08-04 DIAGNOSIS — M9904 Segmental and somatic dysfunction of sacral region: Secondary | ICD-10-CM | POA: Diagnosis not present

## 2022-08-04 DIAGNOSIS — M9902 Segmental and somatic dysfunction of thoracic region: Secondary | ICD-10-CM | POA: Diagnosis not present

## 2022-08-07 DIAGNOSIS — F4322 Adjustment disorder with anxiety: Secondary | ICD-10-CM | POA: Diagnosis not present

## 2022-08-20 DIAGNOSIS — M79662 Pain in left lower leg: Secondary | ICD-10-CM | POA: Diagnosis not present

## 2022-08-21 DIAGNOSIS — M9901 Segmental and somatic dysfunction of cervical region: Secondary | ICD-10-CM | POA: Diagnosis not present

## 2022-08-21 DIAGNOSIS — M9904 Segmental and somatic dysfunction of sacral region: Secondary | ICD-10-CM | POA: Diagnosis not present

## 2022-08-21 DIAGNOSIS — M9902 Segmental and somatic dysfunction of thoracic region: Secondary | ICD-10-CM | POA: Diagnosis not present

## 2022-08-21 DIAGNOSIS — M9903 Segmental and somatic dysfunction of lumbar region: Secondary | ICD-10-CM | POA: Diagnosis not present

## 2022-09-03 DIAGNOSIS — M9904 Segmental and somatic dysfunction of sacral region: Secondary | ICD-10-CM | POA: Diagnosis not present

## 2022-09-03 DIAGNOSIS — M9903 Segmental and somatic dysfunction of lumbar region: Secondary | ICD-10-CM | POA: Diagnosis not present

## 2022-09-03 DIAGNOSIS — M9902 Segmental and somatic dysfunction of thoracic region: Secondary | ICD-10-CM | POA: Diagnosis not present

## 2022-09-03 DIAGNOSIS — M9901 Segmental and somatic dysfunction of cervical region: Secondary | ICD-10-CM | POA: Diagnosis not present

## 2022-09-08 DIAGNOSIS — F4322 Adjustment disorder with anxiety: Secondary | ICD-10-CM | POA: Diagnosis not present

## 2022-09-16 DIAGNOSIS — M9904 Segmental and somatic dysfunction of sacral region: Secondary | ICD-10-CM | POA: Diagnosis not present

## 2022-09-16 DIAGNOSIS — M9903 Segmental and somatic dysfunction of lumbar region: Secondary | ICD-10-CM | POA: Diagnosis not present

## 2022-09-16 DIAGNOSIS — M9901 Segmental and somatic dysfunction of cervical region: Secondary | ICD-10-CM | POA: Diagnosis not present

## 2022-09-16 DIAGNOSIS — M9902 Segmental and somatic dysfunction of thoracic region: Secondary | ICD-10-CM | POA: Diagnosis not present

## 2022-09-30 DIAGNOSIS — M9902 Segmental and somatic dysfunction of thoracic region: Secondary | ICD-10-CM | POA: Diagnosis not present

## 2022-09-30 DIAGNOSIS — M9904 Segmental and somatic dysfunction of sacral region: Secondary | ICD-10-CM | POA: Diagnosis not present

## 2022-09-30 DIAGNOSIS — M9901 Segmental and somatic dysfunction of cervical region: Secondary | ICD-10-CM | POA: Diagnosis not present

## 2022-09-30 DIAGNOSIS — M9903 Segmental and somatic dysfunction of lumbar region: Secondary | ICD-10-CM | POA: Diagnosis not present

## 2022-10-21 DIAGNOSIS — M9904 Segmental and somatic dysfunction of sacral region: Secondary | ICD-10-CM | POA: Diagnosis not present

## 2022-10-21 DIAGNOSIS — M9902 Segmental and somatic dysfunction of thoracic region: Secondary | ICD-10-CM | POA: Diagnosis not present

## 2022-10-21 DIAGNOSIS — M9901 Segmental and somatic dysfunction of cervical region: Secondary | ICD-10-CM | POA: Diagnosis not present

## 2022-10-21 DIAGNOSIS — M9903 Segmental and somatic dysfunction of lumbar region: Secondary | ICD-10-CM | POA: Diagnosis not present

## 2022-10-29 DIAGNOSIS — F4322 Adjustment disorder with anxiety: Secondary | ICD-10-CM | POA: Diagnosis not present

## 2022-11-10 DIAGNOSIS — M79662 Pain in left lower leg: Secondary | ICD-10-CM | POA: Diagnosis not present

## 2022-11-12 DIAGNOSIS — U071 COVID-19: Secondary | ICD-10-CM | POA: Diagnosis not present

## 2022-11-17 DIAGNOSIS — M79662 Pain in left lower leg: Secondary | ICD-10-CM | POA: Diagnosis not present

## 2022-11-20 DIAGNOSIS — M9901 Segmental and somatic dysfunction of cervical region: Secondary | ICD-10-CM | POA: Diagnosis not present

## 2022-11-20 DIAGNOSIS — M9903 Segmental and somatic dysfunction of lumbar region: Secondary | ICD-10-CM | POA: Diagnosis not present

## 2022-11-20 DIAGNOSIS — M9904 Segmental and somatic dysfunction of sacral region: Secondary | ICD-10-CM | POA: Diagnosis not present

## 2022-11-20 DIAGNOSIS — M9902 Segmental and somatic dysfunction of thoracic region: Secondary | ICD-10-CM | POA: Diagnosis not present

## 2022-12-01 DIAGNOSIS — M79662 Pain in left lower leg: Secondary | ICD-10-CM | POA: Diagnosis not present

## 2022-12-02 DIAGNOSIS — E7800 Pure hypercholesterolemia, unspecified: Secondary | ICD-10-CM | POA: Diagnosis not present

## 2022-12-02 DIAGNOSIS — Z6824 Body mass index (BMI) 24.0-24.9, adult: Secondary | ICD-10-CM | POA: Diagnosis not present

## 2022-12-02 DIAGNOSIS — M4802 Spinal stenosis, cervical region: Secondary | ICD-10-CM | POA: Diagnosis not present

## 2022-12-02 DIAGNOSIS — Z23 Encounter for immunization: Secondary | ICD-10-CM | POA: Diagnosis not present

## 2022-12-02 DIAGNOSIS — K219 Gastro-esophageal reflux disease without esophagitis: Secondary | ICD-10-CM | POA: Diagnosis not present

## 2022-12-02 DIAGNOSIS — Z0000 Encounter for general adult medical examination without abnormal findings: Secondary | ICD-10-CM | POA: Diagnosis not present

## 2022-12-03 DIAGNOSIS — M9901 Segmental and somatic dysfunction of cervical region: Secondary | ICD-10-CM | POA: Diagnosis not present

## 2022-12-03 DIAGNOSIS — M25572 Pain in left ankle and joints of left foot: Secondary | ICD-10-CM | POA: Diagnosis not present

## 2022-12-03 DIAGNOSIS — M545 Low back pain, unspecified: Secondary | ICD-10-CM | POA: Diagnosis not present

## 2022-12-03 DIAGNOSIS — M9903 Segmental and somatic dysfunction of lumbar region: Secondary | ICD-10-CM | POA: Diagnosis not present

## 2022-12-03 DIAGNOSIS — M9902 Segmental and somatic dysfunction of thoracic region: Secondary | ICD-10-CM | POA: Diagnosis not present

## 2022-12-03 DIAGNOSIS — M9904 Segmental and somatic dysfunction of sacral region: Secondary | ICD-10-CM | POA: Diagnosis not present

## 2022-12-17 DIAGNOSIS — F4322 Adjustment disorder with anxiety: Secondary | ICD-10-CM | POA: Diagnosis not present

## 2022-12-26 DIAGNOSIS — M545 Low back pain, unspecified: Secondary | ICD-10-CM | POA: Diagnosis not present

## 2022-12-30 DIAGNOSIS — M9902 Segmental and somatic dysfunction of thoracic region: Secondary | ICD-10-CM | POA: Diagnosis not present

## 2022-12-30 DIAGNOSIS — M9901 Segmental and somatic dysfunction of cervical region: Secondary | ICD-10-CM | POA: Diagnosis not present

## 2022-12-30 DIAGNOSIS — M9903 Segmental and somatic dysfunction of lumbar region: Secondary | ICD-10-CM | POA: Diagnosis not present

## 2022-12-30 DIAGNOSIS — M9904 Segmental and somatic dysfunction of sacral region: Secondary | ICD-10-CM | POA: Diagnosis not present

## 2023-01-12 DIAGNOSIS — M545 Low back pain, unspecified: Secondary | ICD-10-CM | POA: Diagnosis not present

## 2023-02-04 DIAGNOSIS — F4322 Adjustment disorder with anxiety: Secondary | ICD-10-CM | POA: Diagnosis not present

## 2023-04-28 DIAGNOSIS — Z124 Encounter for screening for malignant neoplasm of cervix: Secondary | ICD-10-CM | POA: Diagnosis not present

## 2023-04-28 DIAGNOSIS — Z6824 Body mass index (BMI) 24.0-24.9, adult: Secondary | ICD-10-CM | POA: Diagnosis not present

## 2023-04-28 DIAGNOSIS — Z1231 Encounter for screening mammogram for malignant neoplasm of breast: Secondary | ICD-10-CM | POA: Diagnosis not present

## 2023-04-28 DIAGNOSIS — Z1151 Encounter for screening for human papillomavirus (HPV): Secondary | ICD-10-CM | POA: Diagnosis not present

## 2023-04-28 DIAGNOSIS — Z01419 Encounter for gynecological examination (general) (routine) without abnormal findings: Secondary | ICD-10-CM | POA: Diagnosis not present

## 2023-06-04 DIAGNOSIS — H524 Presbyopia: Secondary | ICD-10-CM | POA: Diagnosis not present

## 2023-06-04 DIAGNOSIS — Z961 Presence of intraocular lens: Secondary | ICD-10-CM | POA: Diagnosis not present

## 2023-06-04 DIAGNOSIS — H26491 Other secondary cataract, right eye: Secondary | ICD-10-CM | POA: Diagnosis not present

## 2023-06-12 DIAGNOSIS — Z79899 Other long term (current) drug therapy: Secondary | ICD-10-CM | POA: Diagnosis not present

## 2023-06-12 DIAGNOSIS — E7800 Pure hypercholesterolemia, unspecified: Secondary | ICD-10-CM | POA: Diagnosis not present

## 2023-06-25 DIAGNOSIS — F4322 Adjustment disorder with anxiety: Secondary | ICD-10-CM | POA: Diagnosis not present

## 2023-08-06 DIAGNOSIS — H26491 Other secondary cataract, right eye: Secondary | ICD-10-CM | POA: Diagnosis not present

## 2023-12-01 DIAGNOSIS — F331 Major depressive disorder, recurrent, moderate: Secondary | ICD-10-CM | POA: Diagnosis not present

## 2023-12-01 DIAGNOSIS — F410 Panic disorder [episodic paroxysmal anxiety] without agoraphobia: Secondary | ICD-10-CM | POA: Diagnosis not present
